# Patient Record
Sex: Female | Born: 1975 | Hispanic: No | Marital: Single | Smoking: Never smoker
Health system: Southern US, Community
[De-identification: ages and names within clinical notes are randomized; demographics above are authoritative.]

## PROBLEM LIST (undated history)

## (undated) DIAGNOSIS — Z9189 Other specified personal risk factors, not elsewhere classified: Secondary | ICD-10-CM

## (undated) DIAGNOSIS — K219 Gastro-esophageal reflux disease without esophagitis: Secondary | ICD-10-CM

## (undated) DIAGNOSIS — I739 Peripheral vascular disease, unspecified: Secondary | ICD-10-CM

## (undated) DIAGNOSIS — G8220 Paraplegia, unspecified: Secondary | ICD-10-CM

## (undated) DIAGNOSIS — F32A Depression, unspecified: Secondary | ICD-10-CM

## (undated) DIAGNOSIS — I2699 Other pulmonary embolism without acute cor pulmonale: Secondary | ICD-10-CM

## (undated) DIAGNOSIS — I959 Hypotension, unspecified: Secondary | ICD-10-CM

## (undated) DIAGNOSIS — G839 Paralytic syndrome, unspecified: Secondary | ICD-10-CM

## (undated) DIAGNOSIS — N319 Neuromuscular dysfunction of bladder, unspecified: Secondary | ICD-10-CM

## (undated) DIAGNOSIS — N390 Urinary tract infection, site not specified: Secondary | ICD-10-CM

## (undated) DIAGNOSIS — F319 Bipolar disorder, unspecified: Secondary | ICD-10-CM

## (undated) DIAGNOSIS — R112 Nausea with vomiting, unspecified: Secondary | ICD-10-CM

## (undated) DIAGNOSIS — F419 Anxiety disorder, unspecified: Secondary | ICD-10-CM

## (undated) DIAGNOSIS — S24109A Unspecified injury at unspecified level of thoracic spinal cord, initial encounter: Secondary | ICD-10-CM

## (undated) DIAGNOSIS — F988 Other specified behavioral and emotional disorders with onset usually occurring in childhood and adolescence: Secondary | ICD-10-CM

## (undated) DIAGNOSIS — Z87442 Personal history of urinary calculi: Secondary | ICD-10-CM

## (undated) DIAGNOSIS — R0600 Dyspnea, unspecified: Secondary | ICD-10-CM

## (undated) DIAGNOSIS — F329 Major depressive disorder, single episode, unspecified: Secondary | ICD-10-CM

## (undated) DIAGNOSIS — Z86718 Personal history of other venous thrombosis and embolism: Secondary | ICD-10-CM

## (undated) DIAGNOSIS — I82409 Acute embolism and thrombosis of unspecified deep veins of unspecified lower extremity: Secondary | ICD-10-CM

## (undated) DIAGNOSIS — Z9889 Other specified postprocedural states: Secondary | ICD-10-CM

## (undated) HISTORY — PX: ABDOMINAL HYSTERECTOMY: SHX81

## (undated) HISTORY — PX: COLOSTOMY: SHX63

## (undated) HISTORY — PX: SUPRAPUBIC CATHETER PLACEMENT: SHX2473

## (undated) HISTORY — DX: Personal history of other venous thrombosis and embolism: Z86.718

## (undated) HISTORY — DX: Major depressive disorder, single episode, unspecified: F32.9

## (undated) HISTORY — DX: Depression, unspecified: F32.A

---

## 1999-11-14 ENCOUNTER — Other Ambulatory Visit: Admission: RE | Admit: 1999-11-14 | Discharge: 1999-11-14 | Payer: Self-pay | Admitting: Obstetrics and Gynecology

## 2000-07-31 ENCOUNTER — Encounter: Payer: Self-pay | Admitting: *Deleted

## 2000-07-31 ENCOUNTER — Encounter: Admission: RE | Admit: 2000-07-31 | Discharge: 2000-07-31 | Payer: Self-pay | Admitting: *Deleted

## 2001-08-07 ENCOUNTER — Other Ambulatory Visit: Admission: RE | Admit: 2001-08-07 | Discharge: 2001-08-07 | Payer: Self-pay | Admitting: Obstetrics and Gynecology

## 2011-03-23 ENCOUNTER — Encounter: Payer: Self-pay | Admitting: Internal Medicine

## 2011-04-24 ENCOUNTER — Ambulatory Visit: Payer: Self-pay | Admitting: Internal Medicine

## 2012-10-22 ENCOUNTER — Other Ambulatory Visit: Payer: Self-pay | Admitting: Obstetrics and Gynecology

## 2012-10-22 DIAGNOSIS — N920 Excessive and frequent menstruation with regular cycle: Secondary | ICD-10-CM

## 2012-11-07 ENCOUNTER — Ambulatory Visit
Admission: RE | Admit: 2012-11-07 | Discharge: 2012-11-07 | Disposition: A | Discharge status: Home / Self Care | Payer: Self-pay | Source: Ambulatory Visit | Attending: Obstetrics and Gynecology | Admitting: Obstetrics and Gynecology

## 2012-11-07 DIAGNOSIS — N920 Excessive and frequent menstruation with regular cycle: Secondary | ICD-10-CM

## 2014-07-06 ENCOUNTER — Inpatient Hospital Stay (HOSPITAL_COMMUNITY): Admission: RE | Admit: 2014-07-06 | Payer: Self-pay | Source: Ambulatory Visit

## 2014-07-13 ENCOUNTER — Ambulatory Visit (HOSPITAL_COMMUNITY): Admission: RE | Admit: 2014-07-13 | Payer: Self-pay | Source: Ambulatory Visit | Admitting: Obstetrics and Gynecology

## 2014-07-13 ENCOUNTER — Encounter (HOSPITAL_COMMUNITY): Admission: RE | Payer: Self-pay | Source: Ambulatory Visit

## 2014-07-13 SURGERY — HYSTERECTOMY, VAGINAL, LAPAROSCOPY-ASSISTED
Anesthesia: Choice

## 2017-03-02 HISTORY — PX: HYSTEROTOMY: SHX1776

## 2017-04-24 ENCOUNTER — Other Ambulatory Visit: Payer: Self-pay | Admitting: Obstetrics and Gynecology

## 2017-04-24 DIAGNOSIS — Z1231 Encounter for screening mammogram for malignant neoplasm of breast: Secondary | ICD-10-CM

## 2017-05-14 ENCOUNTER — Ambulatory Visit: Payer: Self-pay

## 2017-05-15 ENCOUNTER — Ambulatory Visit: Payer: Self-pay

## 2017-06-04 ENCOUNTER — Ambulatory Visit
Admission: RE | Admit: 2017-06-04 | Discharge: 2017-06-04 | Disposition: A | Discharge status: Home / Self Care | Payer: BLUE CROSS/BLUE SHIELD | Source: Ambulatory Visit | Attending: Obstetrics and Gynecology | Admitting: Obstetrics and Gynecology

## 2017-06-04 DIAGNOSIS — Z1231 Encounter for screening mammogram for malignant neoplasm of breast: Secondary | ICD-10-CM

## 2017-09-30 HISTORY — PX: BACK SURGERY: SHX140

## 2017-10-31 HISTORY — PX: LITHOTRIPSY: SUR834

## 2017-11-14 ENCOUNTER — Encounter: Payer: Self-pay | Admitting: Gastroenterology

## 2017-11-21 ENCOUNTER — Encounter: Payer: Self-pay | Admitting: Physical Medicine & Rehabilitation

## 2017-11-26 ENCOUNTER — Ambulatory Visit (INDEPENDENT_AMBULATORY_CARE_PROVIDER_SITE_OTHER): Payer: BLUE CROSS/BLUE SHIELD | Admitting: Gastroenterology

## 2017-11-26 ENCOUNTER — Encounter: Payer: Self-pay | Admitting: Gastroenterology

## 2017-11-26 VITALS — BP 110/62 | HR 102 | Ht 63.0 in | Wt 145.0 lb

## 2017-11-26 DIAGNOSIS — K5900 Constipation, unspecified: Secondary | ICD-10-CM | POA: Diagnosis not present

## 2017-11-26 DIAGNOSIS — R1084 Generalized abdominal pain: Secondary | ICD-10-CM | POA: Diagnosis not present

## 2017-11-26 DIAGNOSIS — K592 Neurogenic bowel, not elsewhere classified: Secondary | ICD-10-CM

## 2017-11-26 DIAGNOSIS — R140 Abdominal distension (gaseous): Secondary | ICD-10-CM | POA: Diagnosis not present

## 2017-11-26 DIAGNOSIS — K219 Gastro-esophageal reflux disease without esophagitis: Secondary | ICD-10-CM | POA: Diagnosis not present

## 2017-11-26 DIAGNOSIS — Z791 Long term (current) use of non-steroidal anti-inflammatories (NSAID): Secondary | ICD-10-CM
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
## 2017-11-26 NOTE — Patient Instructions (Addendum)
Omeprazole 20 mg twice a day or 40 mg in evening with dinner.   Stop Dulcolax tablet.   Start miralax twice a day.   You have been scheduled for a CT scan of the abdomen and pelvis at [facility] ([address]---this is in the same building as Press [facility]).   You are scheduled on 12-05-17 at 3:00 pm. You should arrive 15 minutes prior to your appointment time for registration. Please follow the written instructions below on the day of your exam:  WARNING: IF YOU ARE ALLERGIC TO IODINE/X-RAY DYE, PLEASE NOTIFY RADIOLOGY IMMEDIATELY AT [phone]! YOU WILL BE GIVEN A 13 HOUR PREMEDICATION PREP.  1) Do not eat or drink anything after 11:00 am (4 hours prior to your test) 2) You have been given 2 bottles of oral contrast to drink. The solution may taste better if refrigerated, but do NOT add ice or any other liquid to this solution. Shake well before drinking.    Drink 1 bottle of contrast @ 1:00 pm (2 hours prior to your exam)  Drink 1 bottle of contrast @ 2:00 pm (1 hour prior to your exam)  You may take any medications as prescribed with a small amount of water except for the following: Metformin, Glucophage, Glucovance, Avandamet, Riomet, Fortamet, Actoplus Met, Janumet, Glumetza or Metaglip. The above medications must be held the day of the exam AND 48 hours after the exam.  The purpose of you drinking the oral contrast is to aid in the visualization of your intestinal tract. The contrast solution may cause some diarrhea. Before your exam is started, you will be given a small amount of fluid to drink. Depending on your individual set of symptoms, you may also receive an intravenous injection of x-ray contrast/dye. Plan on being at [facility] for 30 minutes or longer, depending on the type of exam you are having performed.  This test typically takes 30-45 minutes to complete.  If you have any questions regarding your exam or if you need to reschedule, you may  call the CT department at [phone] between the hours of 8:00 am and 5:00 pm, Monday-Friday.  ________________________________________________________________________

## 2017-11-27 DIAGNOSIS — R140 Abdominal distension (gaseous): Secondary | ICD-10-CM | POA: Insufficient documentation

## 2017-11-27 DIAGNOSIS — K5900 Constipation, unspecified: Secondary | ICD-10-CM | POA: Insufficient documentation

## 2017-11-27 DIAGNOSIS — Z791 Long term (current) use of non-steroidal anti-inflammatories (NSAID): Secondary | ICD-10-CM | POA: Insufficient documentation

## 2017-11-27 DIAGNOSIS — K219 Gastro-esophageal reflux disease without esophagitis: Secondary | ICD-10-CM | POA: Insufficient documentation

## 2017-11-27 DIAGNOSIS — R1084 Generalized abdominal pain: Secondary | ICD-10-CM | POA: Insufficient documentation

## 2017-11-29 ENCOUNTER — Encounter

## 2017-12-05 ENCOUNTER — Telehealth: Payer: Self-pay | Admitting: Gastroenterology

## 2017-12-05 ENCOUNTER — Inpatient Hospital Stay: Admission: RE | Admit: 2017-12-05 | Payer: BLUE CROSS/BLUE SHIELD | Source: Ambulatory Visit

## 2017-12-05 ENCOUNTER — Ambulatory Visit (INDEPENDENT_AMBULATORY_CARE_PROVIDER_SITE_OTHER): Payer: BLUE CROSS/BLUE SHIELD

## 2017-12-05 DIAGNOSIS — N83201 Unspecified ovarian cyst, right side: Secondary | ICD-10-CM

## 2017-12-05 DIAGNOSIS — R1084 Generalized abdominal pain: Secondary | ICD-10-CM

## 2017-12-05 DIAGNOSIS — R140 Abdominal distension (gaseous): Secondary | ICD-10-CM

## 2017-12-05 MED ORDER — IOPAMIDOL (ISOVUE-300) INJECTION 61%
100.0000 mL | Freq: Once | INTRAVENOUS | Status: AC | PRN
  Administered 2017-12-05: 100 mL via INTRAVENOUS

## 2017-12-05 NOTE — Telephone Encounter (Signed)
Patient states she was getting nauseated drinking contrast and wanted to know if she needed to drink the whole bottle. She states she called and spoke to someone and they said to just drink as much as possible. She is afraid that if she vomits she will have a hard time making it to the bathroom since she is paraplegic. Patient will call back if she has any problems.

## 2017-12-25 ENCOUNTER — Encounter
Payer: BLUE CROSS/BLUE SHIELD | Attending: Physical Medicine & Rehabilitation | Admitting: Physical Medicine & Rehabilitation

## 2017-12-25 ENCOUNTER — Encounter: Payer: Self-pay | Admitting: Physical Medicine & Rehabilitation

## 2017-12-25 VITALS — BP 120/84 | HR 102

## 2017-12-25 DIAGNOSIS — S22079S Unspecified fracture of T9-T10 vertebra, sequela: Secondary | ICD-10-CM | POA: Diagnosis not present

## 2017-12-25 DIAGNOSIS — K592 Neurogenic bowel, not elsewhere classified: Secondary | ICD-10-CM | POA: Insufficient documentation

## 2017-12-25 DIAGNOSIS — F419 Anxiety disorder, unspecified: Secondary | ICD-10-CM | POA: Diagnosis not present

## 2017-12-25 DIAGNOSIS — F329 Major depressive disorder, single episode, unspecified: Secondary | ICD-10-CM | POA: Diagnosis not present

## 2017-12-25 DIAGNOSIS — G8220 Paraplegia, unspecified: Secondary | ICD-10-CM

## 2017-12-25 DIAGNOSIS — S24103S Unspecified injury at T7-T10 level of thoracic spinal cord, sequela: Secondary | ICD-10-CM | POA: Diagnosis present

## 2017-12-25 DIAGNOSIS — S24103A Unspecified injury at T7-T10 level of thoracic spinal cord, initial encounter: Secondary | ICD-10-CM | POA: Insufficient documentation

## 2017-12-25 DIAGNOSIS — N319 Neuromuscular dysfunction of bladder, unspecified: Secondary | ICD-10-CM | POA: Insufficient documentation

## 2017-12-25 DIAGNOSIS — G8911 Acute pain due to trauma: Secondary | ICD-10-CM

## 2017-12-25 DIAGNOSIS — M545 Low back pain: Secondary | ICD-10-CM | POA: Insufficient documentation
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
## 2017-12-25 NOTE — Patient Instructions (Addendum)
BOWELS:  DOUBLE DOSE YOUR MIRALAX AT NIGHT TRY FLEET ENEMA IN THE MORNING.    BLADDER: Ration fluid intake on days you leave the house.

## 2017-12-26 ENCOUNTER — Other Ambulatory Visit: Payer: Self-pay

## 2017-12-26 ENCOUNTER — Emergency Department (HOSPITAL_BASED_OUTPATIENT_CLINIC_OR_DEPARTMENT_OTHER)
Admission: EM | Admit: 2017-12-26 | Discharge: 2017-12-26 | Disposition: A | Discharge status: Home / Self Care | Payer: BLUE CROSS/BLUE SHIELD | Attending: Emergency Medicine | Admitting: Emergency Medicine

## 2017-12-26 ENCOUNTER — Encounter (HOSPITAL_BASED_OUTPATIENT_CLINIC_OR_DEPARTMENT_OTHER): Payer: Self-pay | Admitting: Emergency Medicine

## 2017-12-26 ENCOUNTER — Emergency Department (HOSPITAL_BASED_OUTPATIENT_CLINIC_OR_DEPARTMENT_OTHER): Payer: BLUE CROSS/BLUE SHIELD

## 2017-12-26 DIAGNOSIS — R1031 Right lower quadrant pain: Secondary | ICD-10-CM

## 2017-12-26 DIAGNOSIS — Z7901 Long term (current) use of anticoagulants: Secondary | ICD-10-CM | POA: Diagnosis not present

## 2017-12-26 DIAGNOSIS — Z79899 Other long term (current) drug therapy: Secondary | ICD-10-CM | POA: Diagnosis not present

## 2017-12-26 DIAGNOSIS — N83201 Unspecified ovarian cyst, right side: Secondary | ICD-10-CM

## 2017-12-26 DIAGNOSIS — R109 Unspecified abdominal pain: Secondary | ICD-10-CM

## 2017-12-26 DIAGNOSIS — N12 Tubulo-interstitial nephritis, not specified as acute or chronic: Secondary | ICD-10-CM

## 2017-12-26 HISTORY — DX: Paraplegia, unspecified: G82.20

## 2017-12-26 LAB — CBC WITH DIFFERENTIAL/PLATELET
BASOS PCT: 1 %
Basophils Absolute: 0 10*3/uL (ref 0.0–0.1)
EOS ABS: 0.4 10*3/uL (ref 0.0–0.7)
EOS PCT: 6 %
HCT: 34.9 % — ABNORMAL LOW (ref 36.0–46.0)
Hemoglobin: 10.9 g/dL — ABNORMAL LOW (ref 12.0–15.0)
LYMPHS ABS: 2.2 10*3/uL (ref 0.7–4.0)
Lymphocytes Relative: 38 %
MCH: 28.2 pg (ref 26.0–34.0)
MCHC: 31.2 g/dL (ref 30.0–36.0)
MCV: 90.4 fL (ref 78.0–100.0)
MONOS PCT: 10 %
Monocytes Absolute: 0.6 10*3/uL (ref 0.1–1.0)
Neutro Abs: 2.6 10*3/uL (ref 1.7–7.7)
Neutrophils Relative %: 45 %
PLATELETS: 246 10*3/uL (ref 150–400)
RBC: 3.86 MIL/uL — ABNORMAL LOW (ref 3.87–5.11)
RDW: 15.2 % (ref 11.5–15.5)
WBC: 5.7 10*3/uL (ref 4.0–10.5)

## 2017-12-26 LAB — COMPREHENSIVE METABOLIC PANEL
ALK PHOS: 112 U/L (ref 38–126)
ALT: 18 U/L (ref 0–44)
AST: 16 U/L (ref 15–41)
Albumin: 3.5 g/dL (ref 3.5–5.0)
Anion gap: 8 (ref 5–15)
BUN: 21 mg/dL — AB (ref 6–20)
CALCIUM: 9 mg/dL (ref 8.9–10.3)
CHLORIDE: 103 mmol/L (ref 98–111)
CO2: 28 mmol/L (ref 22–32)
CREATININE: 0.65 mg/dL (ref 0.44–1.00)
GFR calc Af Amer: >60 mL/min (ref >60)
Glucose, Bld: 115 mg/dL — ABNORMAL HIGH (ref 70–99)
Potassium: 4.1 mmol/L (ref 3.5–5.1)
Sodium: 139 mmol/L (ref 135–145)
Total Bilirubin: 0.4 mg/dL (ref 0.3–1.2)
Total Protein: 6.6 g/dL (ref 6.5–8.1)

## 2017-12-26 LAB — URINALYSIS, ROUTINE W REFLEX MICROSCOPIC
BILIRUBIN URINE: NEGATIVE
Glucose, UA: NEGATIVE mg/dL
KETONES UR: NEGATIVE mg/dL
Nitrite: POSITIVE — AB
PROTEIN: NEGATIVE mg/dL
Specific Gravity, Urine: >1.03 — ABNORMAL HIGH (ref 1.005–1.030)
pH: 6 (ref 5.0–8.0)

## 2017-12-26 LAB — URINALYSIS, MICROSCOPIC (REFLEX)

## 2017-12-26 LAB — I-STAT CG4 LACTIC ACID, ED: LACTIC ACID, VENOUS: 1.45 mmol/L (ref 0.5–1.9)

## 2017-12-26 LAB — I-STAT TROPONIN, ED: Troponin i, poc: 0 ng/mL (ref 0.00–0.08)

## 2017-12-26 MED ORDER — ONDANSETRON 4 MG PO TBDP: 4.0000 mg | ORAL_TABLET | Freq: Three times a day (TID) | ORAL | 0 refills | Status: DC | PRN

## 2017-12-26 MED ORDER — ONDANSETRON HCL 4 MG/2ML IJ SOLN
4.0000 mg | Freq: Once | INTRAMUSCULAR | Status: AC
  Administered 2017-12-26: 4 mg via INTRAVENOUS
  Filled 2017-12-26: qty 2

## 2017-12-26 MED ORDER — KETOROLAC TROMETHAMINE 30 MG/ML IJ SOLN
15.0000 mg | Freq: Once | INTRAMUSCULAR | Status: AC
  Administered 2017-12-26: 15 mg via INTRAVENOUS
  Filled 2017-12-26: qty 1

## 2017-12-26 MED ORDER — SODIUM CHLORIDE 0.9 % IV SOLN
1.0000 g | Freq: Once | INTRAVENOUS | Status: AC
  Administered 2017-12-26: 1 g via INTRAVENOUS
  Filled 2017-12-26: qty 10

## 2017-12-26 MED ORDER — CEPHALEXIN 500 MG PO CAPS: 500.0000 mg | ORAL_CAPSULE | Freq: Four times a day (QID) | ORAL | 0 refills | Status: DC

## 2017-12-26 MED ORDER — FENTANYL CITRATE (PF) 100 MCG/2ML IJ SOLN
50.0000 ug | Freq: Once | INTRAMUSCULAR | Status: AC
  Administered 2017-12-26: 50 ug via INTRAVENOUS
  Filled 2017-12-26: qty 2

## 2017-12-26 MED ORDER — SODIUM CHLORIDE 0.9 % IV BOLUS
1000.0000 mL | Freq: Once | INTRAVENOUS | Status: AC
  Administered 2017-12-26: 1000 mL via INTRAVENOUS

## 2017-12-26 NOTE — ED Triage Notes (Signed)
Pt is a paraplegic, she uses a catheter for urination. C/o R flank pain, cloudy urine and chills x 4 days.

## 2017-12-26 NOTE — Discharge Instructions (Addendum)
Please read and follow all provided instructions.  Your diagnoses today include:  1. Pyelonephritis   2. Cyst of right ovary   3. Right lower quadrant abdominal pain   4. Right flank pain    You have an infection in your urine. We are treating with keflex. You also have a ovarian cyst on your right ovary. Please have a follow up ultrasound in about two months.   Tests performed today include: Urine test - suggests that you have an infection in your bladder Vital signs. See below for your results today.     Home care instructions:  Follow any educational materials contained in this packet.  Follow-up instructions: Please follow-up with your primary care provider in 3 days if symptoms are not resolved for further evaluation of your symptoms.  Return instructions:  Please return to the Emergency Department if you experience worsening symptoms.  Return with fever, worsening pain, persistent vomiting, worsening pain in your back.  Please return if you have any other emergent concerns.  Additional Information:  Your vital signs today were: BP 139/89 (BP Location: Left Arm)    Pulse 90    Temp 97.9 F (36.6 C) (Oral)    Resp 20    Ht 5\' 3"  (1.6 m)    Wt 65.8 kg    SpO2 100%    BMI 25.69 kg/m  If your blood pressure (BP) was elevated above 135/85 this visit, please have this repeated by your doctor within one month. --------------

## 2017-12-26 NOTE — ED Provider Notes (Signed)
[facility] EMERGENCY DEPARTMENT Provider Note   CSN: [id number] Arrival date & time: 12/26/17  1558     History   Chief Complaint Chief Complaint  Patient presents with  . Abdominal Pain    HPI Ashley Gentry is a 42 y.o. female.  Ashley Gentry is a 42 y.o. 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  She also reports associated malodorous urine.  Her spouse is here who takes care of her.  He reports lots of malodorous urine and cloudy colored urine.  Patient has very little sensation below T10.  She reports a dull pain in her right lower quadrant, that was worse last night, but better now.  She is also been having some right flank pain.  She has had kidney stones before.  She is not sure if this is the exact pain as she is recently paraplegic.  She has had a previous abdominal hysterectomy.  No other previous abdominal surgeries.  She has had subjective fevers at home.  No vomiting or diarrhea.  She does straight cath for her neurogenic bladder.  She denies vomiting, diarrhea, vaginal bleeding, syncope, chest pain, coughing, shortness of breath or rashes.  The history is provided by the patient, the spouse and medical records. No language interpreter was used.  Abdominal Pain   Associated symptoms include fever and nausea. Pertinent negatives include diarrhea, vomiting and headaches.    Past Medical History:  Diagnosis Date  . Depression   . Hx of blood clots   . Paraplegia [facility])     Patient Active Problem List   Diagnosis Date Noted  . T10 spinal cord injury (HCC) 12/25/2017  . Paraplegia (HCC) 12/25/2017  . Neurogenic bladder 12/25/2017  . Neurogenic bowel 12/25/2017  . Abdominal distension (gaseous) 11/27/2017  . Generalized abdominal pain 11/27/2017  . Constipation due to neurogenic bowel 11/27/2017  .  Gastroesophageal reflux disease 11/27/2017  . NSAID long-term use 11/27/2017    Past Surgical History:  Procedure Laterality Date  . ABDOMINAL HYSTERECTOMY    . BACK SURGERY  09/2017   Spinal surgery-paralyzed  . HYSTEROTOMY  03/2017  . LITHOTRIPSY Left 10/2017   [facility]     OB History   None      Home Medications    Prior to Admission medications   Medication Sig Start Date End Date Taking? Authorizing Provider  amitriptyline (ELAVIL) 25 MG tablet Take 1 tablet by mouth at bedtime. 10/10/17   [provider]  apixaban (ELIQUIS) 5 MG TABS tablet Take 5 mg by mouth every morning.    [provider]  bisacodyl (DULCOLAX) 5 MG EC tablet Take 1 tablet by mouth daily.    [provider]  Brexpiprazole (REXULTI) 2 MG TABS Take 1 tablet by mouth daily.    [provider]  cephALEXin (KEFLEX) 500 MG capsule Take 1 capsule (500 mg total) by mouth 4 (four) times daily. 12/26/17   Everlene Farrier, PA-C  Enoxaparin Sodium (LOVENOX [place]) Inject 1 application into the skin at bedtime.    [provider]  ERYTHROMYCIN BASE PO Take 1 capsule by mouth QID.    [provider]  flutamide (EULEXIN) 125 MG capsule Take 125 mg by mouth 2 (two) times daily.    [provider]  lamoTRIgine (LAMICTAL) 200 MG tablet Take 200 mg by mouth daily.  [provider]  meloxicam (MOBIC) 7.5 MG tablet Take 7.5 mg by mouth 2 (two) times daily.    [provider]  methocarbamol (ROBAXIN) 500 MG tablet Take 500 mg by mouth 4 (four) times daily.    [provider]  methylphenidate 54 MG PO CR tablet Take 54 mg by mouth daily.    [provider]  mirabegron ER (MYRBETRIQ) 50 MG TB24 tablet Take 50 mg by mouth daily.    [provider]  Naloxegol Oxalate (MOVANTIK PO) Take 1 tablet by mouth daily.    [provider]  omeprazole (PRILOSEC OTC) 20 MG tablet Take 20 mg by mouth 2 (two) times daily.    [provider]  ondansetron (ZOFRAN ODT) 4 MG disintegrating tablet Take 1 tablet (4 mg total) by mouth every 8 (eight) hours as needed for nausea or vomiting. 12/26/17   Everlene Farrier, PA-C  oxyCODONE-acetaminophen (PERCOCET/ROXICET) 5-325 MG tablet Take 2 tablets by mouth every 4 (four) hours as needed for severe pain.    [provider]  Pregabalin (LYRICA PO) Take 1 capsule by mouth 3 (three) times daily.    [provider]  QUEtiapine (SEROQUEL) 400 MG tablet Take 400 mg by mouth at bedtime.    [provider]  TRAMADOL HCL PO Take 1 tablet by mouth every 4 (four) hours as needed.    [provider]    Family History Family History  Problem Relation Age of Onset  . Colon cancer Maternal Grandfather   . Esophageal cancer Neg Hx   . Rectal cancer Neg Hx     Social History Social History   Tobacco Use  . Smoking status: Never Smoker  . Smokeless tobacco: Never Used  Substance Use Topics  . Alcohol use: Not Currently  . Drug use: Never     Allergies   Patient has no known allergies.   Review of Systems Review of Systems  Constitutional: Positive for chills and fever.  HENT: Negative for congestion and sore throat.   Eyes: Negative for visual disturbance.  Respiratory: Negative for cough, shortness of breath and wheezing.   Cardiovascular: Negative for chest pain and palpitations.  Gastrointestinal: Positive for abdominal pain and nausea. Negative for diarrhea and vomiting.  Genitourinary: Positive for flank pain.  Musculoskeletal: Negative for back pain and neck pain.  Skin: Negative for rash.  Neurological: Negative for syncope and headaches.     Physical Exam Updated Vital Signs BP (!) 150/98 (BP Location: Right Arm)   Pulse 86   Temp 97.9 F (36.6 C) (Oral)   Resp 12   Ht 5\' 3"  (1.6 m)   Wt 65.8 kg   SpO2 100%   BMI 25.69 kg/m   Physical Exam  Constitutional: She appears well-developed and well-nourished.  Non-toxic  appearance. She does not appear ill. No distress.  Nontoxic appearing.  HENT:  Head: Normocephalic and atraumatic.  Mouth/Throat: Oropharynx is clear and moist.  Eyes: Pupils are equal, round, and reactive to light. Conjunctivae are normal. Right eye exhibits no discharge. Left eye exhibits no discharge.  Neck: Neck supple.  Cardiovascular: Regular rhythm, normal heart sounds and intact distal pulses. Exam reveals no gallop and no friction rub.  No murmur heard. HR 100.   Pulmonary/Chest: Effort normal and breath sounds normal. No respiratory distress. She has no wheezes. She has no rales.  Abdominal: Soft. Bowel sounds are normal. There is tenderness in the right lower quadrant.  Abdomen is soft.  Bowel sounds  are present.  She has mild generalized right lower quadrant tenderness to palpation.  She has right flank tenderness to palpation as well.  Musculoskeletal: She exhibits no edema.  Lymphadenopathy:    She has no cervical adenopathy.  Neurological: She is alert. Coordination normal.  Skin: Skin is warm and dry. Capillary refill takes less than 2 seconds. No rash noted. She is not diaphoretic. No erythema. No pallor.  Psychiatric: She has a normal mood and affect. Her behavior is normal.  Nursing note and vitals reviewed.    ED Treatments / Results  Labs (all labs ordered are listed, but only abnormal results are displayed) Labs Reviewed  COMPREHENSIVE METABOLIC PANEL - Abnormal; Notable for the following components:      Result Value   Glucose, Bld 115 (*)    BUN 21 (*)    All other components within normal limits  CBC WITH DIFFERENTIAL/PLATELET - Abnormal; Notable for the following components:   RBC 3.86 (*)    Hemoglobin 10.9 (*)    HCT 34.9 (*)    All other components within normal limits  URINALYSIS, ROUTINE W REFLEX MICROSCOPIC - Abnormal; Notable for the following components:   APPearance CLOUDY (*)    Specific Gravity, Urine >1.030 (*)    Hgb urine dipstick LARGE  (*)    Nitrite POSITIVE (*)    Leukocytes, UA SMALL (*)    All other components within normal limits  URINALYSIS, MICROSCOPIC (REFLEX) - Abnormal; Notable for the following components:   Bacteria, UA MANY (*)    All other components within normal limits  URINE CULTURE  I-STAT CG4 LACTIC ACID, ED  I-STAT TROPONIN, ED  I-STAT CG4 LACTIC ACID, ED    EKG None  Radiology Ct Renal Stone Study  Result Date: 12/26/2017 CLINICAL DATA:  Right flank pain, cloudy urine and chills for the past 4 days. Paraplegic. The patient uses a catheter urination. EXAM: CT ABDOMEN AND PELVIS WITHOUT CONTRAST TECHNIQUE: Multidetector CT imaging of the abdomen and pelvis was performed following the standard protocol without IV contrast. COMPARISON:  12/05/2017. FINDINGS: Lower chest: Stable minimal left basilar atelectasis or scarring. Hepatobiliary: No focal liver abnormality is seen. No gallstones, gallbladder wall thickening, or biliary dilatation. Pancreas: Unremarkable. No pancreatic ductal dilatation or surrounding inflammatory changes. Spleen: Normal in size without focal abnormality. Adrenals/Urinary Tract: Adrenal glands are unremarkable. Kidneys are normal, without renal calculi, focal lesion, or hydronephrosis. Bladder is unremarkable. Stomach/Bowel: Prominent stool throughout the colon. Unremarkable stomach and small bowel. No evidence of appendicitis. Vascular/Lymphatic: No significant vascular findings are present. No enlarged abdominal or pelvic lymph nodes. Reproductive: Status post hysterectomy. Interval decrease in size of the complex right adnexal cystic lesion with no overall increase in density. This is mildly heterogeneous and currently measures 4.6 x 2.9 cm, previously 4.8 x 4.5 cm. Normal appearing left ovary. Other: No abdominal wall hernia or abnormality. No abdominopelvic ascites. Musculoskeletal: Stable pedicle screw and rod fixation of the thoracolumbar spine. The remainder the bones are  unremarkable. IMPRESSION: 1. No evidence of obstructive uropathy. 2. Interval decrease in size of the complex right adnexal cystic lesion, currently measuring 4.6 x 2.9 cm, previously 4.8 x 4.5 cm. This most likely represents a resolving hemorrhagic cyst. A follow-up transabdominal and transvaginal pelvic ultrasound is recommended in 2 months. 3. Prominent stool throughout the colon. Electronically Signed   By: Beckie Salts M.D.   On: 12/26/2017 18:41    Procedures Procedures (including critical care time)  Medications Ordered in ED Medications  sodium chloride 0.9 % bolus 1,000 mL (0 mLs Intravenous Stopped 12/26/17 1850)  ondansetron (ZOFRAN) injection 4 mg (4 mg Intravenous Given 12/26/17 1735)  fentaNYL (SUBLIMAZE) injection 50 mcg (50 mcg Intravenous Given 12/26/17 1735)  cefTRIAXone (ROCEPHIN) 1 g in sodium chloride 0.9 % 100 mL IVPB (0 g Intravenous Stopped 12/26/17 1924)  ketorolac (TORADOL) 30 MG/ML injection 15 mg (15 mg Intravenous Given 12/26/17 2003)     Initial Impression / Assessment and Plan / ED Course  I have reviewed the triage vital signs and the nursing notes.  Pertinent labs & imaging results that were available during my care of the patient were reviewed by me and considered in my medical decision making (see chart for details).    This is a 42 y.o. 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  She also reports associated malodorous urine.  Her spouse is here who takes care of her.  He reports lots of malodorous urine and cloudy colored urine.  Patient has very little sensation below T10.  She reports a dull pain in her right lower quadrant, that was worse last night, but better now.  She is also been having some right flank pain.  She has had kidney stones before.  She is not sure if this is the exact pain as she is recently  paraplegic.  She has had a previous abdominal hysterectomy.  No other previous abdominal surgeries.  She has had subjective fevers at home.  No vomiting or diarrhea.  She does straight cath for her neurogenic bladder.  On exam the patient is afebrile and nontoxic appearing.  She is mildly tachycardic on initial evaluation.  Her abdomen is soft and she has mild right lower quadrant tenderness as well as some right flank tenderness to palpation.  Patient reports that she is been having some muscle pain around her right flank and she is not sure if this is new pain or not. Urinalysis shows infection.  Nitrite positive urine.  Many bacteria.  Urine sent for culture.  Patient started on Rocephin.  On extensive chart review I was not able to find any recent urine cultures. CBC shows no leukocytosis.  CMP shows normal liver enzymes and preserved kidney function. CT renal stone study shows no obstructive uropathy.  It does show a complex right adnexal cystic lesion that is shrinking in size from her last scan.  There is also prominent stool burden.  At reevaluation patient is feeling much better.  She is no longer having any abdominal pain.  As she is a paraplegic and its unclear if she is having new flank pain we will treat for pyelonephritis with Keflex 4 times daily for 10 days.  Urine has been sent for culture.  She has no obstructive uropathy on CT scan.  I encouraged her to have a repeat ultrasound in 2 months for her right ovarian cyst.  Patient agrees with plan.  I discussed strict and specific return precautions. I advised the patient to follow-up with their primary care provider this week. I advised the patient to return to the emergency department with new or worsening symptoms or new concerns. The patient verbalized understanding and agreement with plan.    This patient was discussed with Dr. Adela Lank who agrees with assessment and plan.    Final Clinical Impressions(s) / ED Diagnoses   Final  diagnoses:  Pyelonephritis  Cyst of right ovary  Right lower quadrant abdominal pain  Right flank pain    ED Discharge Orders         Ordered    cephALEXin (KEFLEX) 500 MG capsule  4 times daily,   Status:  Discontinued     12/26/17 1939    ondansetron (ZOFRAN ODT) 4 MG disintegrating tablet  Every 8 hours PRN,   Status:  Discontinued     12/26/17 1939    cephALEXin (KEFLEX) 500 MG capsule  4 times daily     12/26/17 1944    ondansetron (ZOFRAN ODT) 4 MG disintegrating tablet  Every 8 hours PRN     12/26/17 1944           Everlene Farrier, PA-C 12/26/17 2015    Melene Plan, DO 12/26/17 2309

## 2017-12-29 LAB — URINE CULTURE: Culture: >=100000 — AB

## 2017-12-30 ENCOUNTER — Telehealth: Payer: Self-pay | Admitting: Emergency Medicine

## 2017-12-30 ENCOUNTER — Telehealth: Payer: Self-pay | Admitting: *Deleted

## 2017-12-30 NOTE — Telephone Encounter (Signed)
Patient left a message inquiring about her referral to a neurosurgeon.  I contacted patient and left a voicemail  Informing that referral was submitted to [facility] ( Dr. Conchita Paris) on September 25th.  I informed that referrals can take a couple of weeks to process. They will contact once referral has been approved.

## 2017-12-30 NOTE — Telephone Encounter (Signed)
Post ED Visit - Positive Culture Follow-up  Culture report reviewed by antimicrobial stewardship pharmacist:  []  Enzo Bi, Pharm.D. []  Celedonio Miyamoto, Pharm.D., BCPS AQ-ID []  Garvin Fila, Pharm.D., BCPS []  Georgina Pillion, Pharm.D., BCPS []  [place], [address].D., BCPS, AAHIVP []  Estella Husk, Pharm.D., BCPS, AAHIVP []  Lysle Pearl, PharmD, BCPS []  Phillips Climes, PharmD, BCPS [x]  Agapito Games, PharmD, BCPS []  Verlan Friends, PharmD  Positive Urine culture Treated with Cephalexin, organism sensitive to the same and no further patient follow-up is required at this time.  Ashley Gentry 12/30/2017, 11:00 AM

## 2018-01-15 ENCOUNTER — Ambulatory Visit: Payer: BLUE CROSS/BLUE SHIELD | Admitting: Internal Medicine

## 2018-01-15 ENCOUNTER — Telehealth: Payer: Self-pay

## 2018-01-15 NOTE — Telephone Encounter (Signed)
No show letter mailed to patient. 

## 2018-01-17 ENCOUNTER — Ambulatory Visit: Payer: Self-pay | Admitting: Psychiatry

## 2018-01-20 ENCOUNTER — Inpatient Hospital Stay: Admission: RE | Admit: 2018-01-20 | Payer: BLUE CROSS/BLUE SHIELD | Source: Ambulatory Visit

## 2018-01-20 ENCOUNTER — Telehealth: Payer: Self-pay | Admitting: Physical Medicine & Rehabilitation

## 2018-01-20 NOTE — Telephone Encounter (Signed)
Ashley Gentry with [facility] called to let us know that this patient has been declined to be seen by their office.  She was a previously referred from Dr. Nedra Hai and a self referral by the patient.  They both were declined.  Do you have any other suggestions where you may want to send this patient?  Thanks.

## 2018-01-21 NOTE — Telephone Encounter (Signed)
I called their office and typically they don't give a reason, but Roselyn Meier with their office said that if they just had surgery they normally don't see them.  They recommend that patient  follow up with who was treating them.  She also said that they wouldn't see anyone until at least a year after surgery.

## 2018-01-21 NOTE — Telephone Encounter (Signed)
Why was she declined?

## 2018-01-21 NOTE — Telephone Encounter (Signed)
I talked with Dr. Patric Dykes about her case and explained the scenario that she wanted to change her care to local her in [place]. There didn't appear to be an issue.

## 2018-01-22 ENCOUNTER — Encounter: Payer: BLUE CROSS/BLUE SHIELD | Admitting: Physical Medicine & Rehabilitation

## 2018-01-22 DIAGNOSIS — F900 Attention-deficit hyperactivity disorder, predominantly inattentive type: Secondary | ICD-10-CM

## 2018-01-22 DIAGNOSIS — G4700 Insomnia, unspecified: Secondary | ICD-10-CM

## 2018-01-22 DIAGNOSIS — F458 Other somatoform disorders: Principal | ICD-10-CM

## 2018-01-22 DIAGNOSIS — F3181 Bipolar II disorder: Secondary | ICD-10-CM

## 2018-01-22 DIAGNOSIS — F419 Anxiety disorder, unspecified: Secondary | ICD-10-CM | POA: Insufficient documentation

## 2018-01-24 ENCOUNTER — Ambulatory Visit
Admission: RE | Admit: 2018-01-24 | Discharge: 2018-01-24 | Disposition: A | Discharge status: Home / Self Care | Payer: BLUE CROSS/BLUE SHIELD | Source: Ambulatory Visit | Attending: Physical Medicine & Rehabilitation | Admitting: Physical Medicine & Rehabilitation

## 2018-01-24 DIAGNOSIS — G8911 Acute pain due to trauma: Secondary | ICD-10-CM

## 2018-01-27 ENCOUNTER — Encounter
Payer: BLUE CROSS/BLUE SHIELD | Attending: Physical Medicine & Rehabilitation | Admitting: Physical Medicine & Rehabilitation

## 2018-01-27 ENCOUNTER — Encounter: Payer: Self-pay | Admitting: Physical Medicine & Rehabilitation

## 2018-01-27 VITALS — BP 130/98 | HR 88 | Resp 16

## 2018-01-27 DIAGNOSIS — K592 Neurogenic bowel, not elsewhere classified: Secondary | ICD-10-CM

## 2018-01-27 DIAGNOSIS — G8220 Paraplegia, unspecified: Secondary | ICD-10-CM

## 2018-01-27 DIAGNOSIS — F329 Major depressive disorder, single episode, unspecified: Secondary | ICD-10-CM | POA: Diagnosis not present

## 2018-01-27 DIAGNOSIS — S24103S Unspecified injury at T7-T10 level of thoracic spinal cord, sequela: Secondary | ICD-10-CM | POA: Diagnosis present

## 2018-01-27 DIAGNOSIS — F419 Anxiety disorder, unspecified: Secondary | ICD-10-CM | POA: Diagnosis not present

## 2018-01-27 DIAGNOSIS — M545 Low back pain: Secondary | ICD-10-CM | POA: Insufficient documentation

## 2018-01-27 DIAGNOSIS — N319 Neuromuscular dysfunction of bladder, unspecified: Secondary | ICD-10-CM

## 2018-01-27 NOTE — Progress Notes (Signed)
Subjective:    Patient ID: Ashley Gentry, female    DOB: 1975-10-29, 42 y.o.   MRN: [id number]  HPI   [name] is here in follow-up of her thoracic spinal cord injury.  I last saw her a month ago.  I reviewed the patient's imaging in the office with her and family today.  Thoracic imaging shows stable thoracic spine with ongoing anterolisthesis of T10 on T11 with mild deformity still noted at the anterior portion of T11.  The lumbar spine appears intact and there is no sign of obvious fracture or loss of vertebral body height.  All disc spaces look within normal limits.  She is continued to have pain radiating from the back into the flanks.  However, two and a half weeks ago, she developed urinary symptoms, ?UTI. Was seen in the ED. She also has had right lower quadrant pain as well over that time.  She apparently was given antibiotics but is still having some symptoms.  The pain in her right flank seems different than the nerve pain she is experienced in the past.  We discussed her bowel program at length at last visit visit but it appears to be an ongoing problem.  She goes from days of no bowel movements to large liquid blowouts.  Fluid intake is consistent as well as p.o. intake.  Her bowel regimen has been all over the place.  She still remains very limited by her brace and cannot perform bowel program or her bladder program.  Significant other helps with a substantially.   Pain Inventory Average Pain 7 Pain Right Now 7 My pain is stabbing and throbbing  In the last 24 hours, has pain interfered with the following? General activity na Relation with others na Enjoyment of life na What TIME of day is your pain at its worst? evening Sleep (in general) Good  Pain is worse with: unsure Pain improves with: ? Relief from Meds: varies  Mobility use a wheelchair  Function disabled: date disabled .  Neuro/Psych bladder control problems trouble walking  Prior Studies Any  changes since last visit?  no  Physicians involved in your care Any changes since last visit?  no   Family History  Problem Relation Age of Onset  . Colon cancer Maternal Grandfather   . Esophageal cancer Neg Hx   . Rectal cancer Neg Hx    Social History   Socioeconomic History  . Marital status: Single    Spouse name: Not on file  . Number of children: 0  . Years of education: Not on file  . Highest education level: Not on file  Occupational History  . Not on file  Social Needs  . Financial resource strain: Not on file  . Food insecurity:    Worry: Not on file    Inability: Not on file  . Transportation needs:    Medical: Not on file    Non-medical: Not on file  Tobacco Use  . Smoking status: Never Smoker  . Smokeless tobacco: Never Used  Substance and Sexual Activity  . Alcohol use: Not Currently  . Drug use: Never  . Sexual activity: Not on file  Lifestyle  . Physical activity:    Days per week: Not on file    Minutes per session: Not on file  . Stress: Not on file  Relationships  . Social connections:    Talks on phone: Not on file    Gets together: Not on file  Attends religious service: Not on file    Active member of club or organization: Not on file    Attends meetings of clubs or organizations: Not on file    Relationship status: Not on file  Other Topics Concern  . Not on file  Social History Narrative  . Not on file   Past Surgical History:  Procedure Laterality Date  . ABDOMINAL HYSTERECTOMY    . BACK SURGERY  09/2017   Spinal surgery-paralyzed  . HYSTEROTOMY  03/2017  . LITHOTRIPSY Left 10/2017   [facility]   Past Medical History:  Diagnosis Date  . Depression   . Hx of blood clots   . Paraplegia (HCC)    BP (!) 130/98   Pulse 88   Resp 16   SpO2 97%   Opioid Risk Score:   Fall Risk Score:  `1  Depression screen PHQ 2/9  No flowsheet data found.  Review of Systems  Constitutional: Negative.   HENT: Negative.   Eyes:  Negative.   Respiratory: Negative.   Gastrointestinal: Positive for abdominal pain.  Endocrine: Negative.   Genitourinary: Positive for difficulty urinating.  Musculoskeletal: Positive for gait problem.  Skin: Negative.   Allergic/Immunologic: Negative.   Hematological: Negative.   Psychiatric/Behavioral: Negative.   All other systems reviewed and are negative.      Objective:   Physical Exam  General: No acute distress HEENT: EOMI, oral membranes moist Cards: reg rate  Chest: normal effort Abdomen: Soft, no palpable masses. TTP RLQ Skin: dry, intact Extremities: no edema  Skin: Clean and intact without signs of breakdown Neuro: Pt is cognitively appropriate with normal insight, memory, and awareness. Cranial nerves 2-12 are intact.  . Reflexes are 2+ in all 4's. Fine motor coordination is intact. No tremors. A few beats of clonus in each leg. No resting tone. Motor remains  0/5 bilateral lower ext. Does sense gross touch below level of injury but inconsistent. Sensory level remains around T9-10.  Musculoskeletal: wearing a TLSO which has been cut down to essentially an LSO brace which appears to be fitting appropriately Psych: anxious but pleasant         Assessment & Plan:  1. T10 SCI motor complete 2. Hx of T10 -11 fx and discloaction 3. L1 pedicle/ compression fracture which occurred post-operatively 4. History of depression and claustrophobia, ?anxiety disorder   Plan: 1.  Spent amounts of time today discussing anatomy and pathophysiology behind her pain and injury. 2. CT's reviewed at length.  She has the anterolisthesis at T10 on T11 with a anterior fragment noted on T11.  Hardware appears stable.  L1 fracture is nonexistent.  Would still like her to see neurosurgery but given the chronicity of her back injury I do not see any specific need for the brace at this point.  Its only hindering her self-care 3.  Given her right lower quadrant pain, I ordered a  urinalysis and urine culture today.  May be related to her bowels.  Consider abdominal ultrasound or CT. 4. Continue pain mgt as she's doing 5.  Intermittent catheterizations as needed. 6.  Reviewed bowel program at length again today.  Asked him to start with senna S1-2 tablets at bedtime with Dulcolax suppository in the morning.  We can build from there.  Discussed the importance of regular fluid and solid intake. 7.  Discussed her tachycardia which may be an autonomic response to her bowels being backed up as well as a UTI. 8. Consider inpatient rehab stay now  that she is out of the brace.  We will discuss with her admission team.  There are certainly things to work on such as her bowel program intermittent catheterizations, pain management, improved functional mobility, as well as further education.  Follow up in about a month. 30 min  was spent in direct consultation, review, examination, etc. I will see her back in about a month's time.

## 2018-01-27 NOTE — Patient Instructions (Addendum)
PLEASE FEEL FREE TO CALL OUR OFFICE WITH ANY PROBLEMS OR QUESTIONS [phone])    BOWEL PROGRAM:  SENNA-S 1-2 TABS AT BED TIME  DULCOLAX SUPPOSITORY IN THE AM.

## 2018-01-30 ENCOUNTER — Telehealth: Payer: Self-pay | Admitting: Physical Medicine & Rehabilitation

## 2018-01-30 NOTE — Telephone Encounter (Signed)
Recd call from [employer] and [facility] - need last med recs sent to [facility]  - recd fax confirmation from both before 9 am today -LVM  For [place] and spoke with Clayburn Pert

## 2018-01-31 ENCOUNTER — Ambulatory Visit: Payer: Self-pay | Admitting: Psychiatry

## 2018-02-05 ENCOUNTER — Other Ambulatory Visit: Payer: Self-pay | Admitting: Urology

## 2018-02-06 ENCOUNTER — Other Ambulatory Visit: Payer: Self-pay

## 2018-02-06 ENCOUNTER — Encounter (HOSPITAL_COMMUNITY): Payer: Self-pay | Admitting: *Deleted

## 2018-02-07 ENCOUNTER — Ambulatory Visit: Payer: Self-pay | Admitting: Psychiatry

## 2018-02-14 ENCOUNTER — Ambulatory Visit: Payer: Self-pay | Admitting: Psychiatry

## 2018-02-14 ENCOUNTER — Telehealth: Payer: Self-pay | Admitting: Internal Medicine

## 2018-02-14 NOTE — Telephone Encounter (Signed)
Error

## 2018-02-17 ENCOUNTER — Ambulatory Visit: Payer: Self-pay | Admitting: Psychiatry

## 2018-02-17 ENCOUNTER — Other Ambulatory Visit: Payer: Self-pay | Admitting: Gastroenterology

## 2018-02-17 DIAGNOSIS — K5900 Constipation, unspecified: Secondary | ICD-10-CM

## 2018-02-18 ENCOUNTER — Encounter
Payer: BLUE CROSS/BLUE SHIELD | Attending: Physical Medicine & Rehabilitation | Admitting: Physical Medicine & Rehabilitation

## 2018-02-18 DIAGNOSIS — S24103S Unspecified injury at T7-T10 level of thoracic spinal cord, sequela: Secondary | ICD-10-CM | POA: Insufficient documentation

## 2018-02-18 DIAGNOSIS — F329 Major depressive disorder, single episode, unspecified: Secondary | ICD-10-CM | POA: Insufficient documentation

## 2018-02-18 DIAGNOSIS — M545 Low back pain: Secondary | ICD-10-CM | POA: Insufficient documentation

## 2018-02-18 DIAGNOSIS — F419 Anxiety disorder, unspecified: Secondary | ICD-10-CM | POA: Insufficient documentation

## 2018-02-19 ENCOUNTER — Telehealth: Payer: Self-pay | Admitting: Physical Medicine & Rehabilitation

## 2018-02-19 NOTE — Telephone Encounter (Signed)
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  Advised she missed appointment yesterday and it would be after first of year before next opening.  Spoke to Dr. Riley KillSwartz - he advised since condition has changed she would need physicians in hospital would need to do referral for her as they know present state.  Called Ashley Gentry back and advised of such.Marland Kitchen..Marland Kitchen

## 2018-02-20 NOTE — Progress Notes (Signed)
Spoke with patient and she is currently [facility].  Patient has no idea when will be discharged.  Patient stated she had left a message with [facility] regarding hospitalization.  [facility].

## 2018-02-24 ENCOUNTER — Ambulatory Visit: Payer: Self-pay | Admitting: Psychiatry

## 2018-02-25 ENCOUNTER — Ambulatory Visit (HOSPITAL_COMMUNITY): Admission: RE | Admit: 2018-02-25 | Payer: BLUE CROSS/BLUE SHIELD | Source: Ambulatory Visit | Admitting: Urology

## 2018-02-25 HISTORY — DX: Dyspnea, unspecified: R06.00

## 2018-02-25 HISTORY — DX: Nausea with vomiting, unspecified: R11.2

## 2018-02-25 HISTORY — DX: Anxiety disorder, unspecified: F41.9

## 2018-02-25 HISTORY — DX: Neuromuscular dysfunction of bladder, unspecified: N31.9

## 2018-02-25 HISTORY — DX: Acute embolism and thrombosis of unspecified deep veins of unspecified lower extremity: I82.409

## 2018-02-25 HISTORY — DX: Urinary tract infection, site not specified: N39.0

## 2018-02-25 HISTORY — DX: Bipolar disorder, unspecified: F31.9

## 2018-02-25 HISTORY — DX: Other specified behavioral and emotional disorders with onset usually occurring in childhood and adolescence: F98.8

## 2018-02-25 HISTORY — DX: Unspecified injury at unspecified level of thoracic spinal cord, initial encounter: S24.109A

## 2018-02-25 HISTORY — DX: Gastro-esophageal reflux disease without esophagitis: K21.9

## 2018-02-25 HISTORY — DX: Personal history of urinary calculi: Z87.442

## 2018-02-25 HISTORY — DX: Other specified postprocedural states: Z98.890

## 2018-02-25 SURGERY — BOTOX INJECTION
Anesthesia: Choice

## 2018-03-07 ENCOUNTER — Ambulatory Visit: Payer: Self-pay | Admitting: Psychiatry

## 2018-03-14 ENCOUNTER — Ambulatory Visit: Payer: Self-pay | Admitting: Psychiatry

## 2018-03-21 ENCOUNTER — Ambulatory Visit: Payer: Self-pay | Admitting: Psychiatry

## 2018-04-02 HISTORY — PX: COLONOSCOPY: SHX174

## 2018-04-04 ENCOUNTER — Ambulatory Visit: Payer: Self-pay | Admitting: Psychiatry

## 2018-05-19 ENCOUNTER — Ambulatory Visit: Payer: BLUE CROSS/BLUE SHIELD | Attending: Physical Medicine and Rehabilitation | Admitting: Occupational Therapy

## 2018-05-19 ENCOUNTER — Other Ambulatory Visit: Payer: Self-pay

## 2018-05-19 ENCOUNTER — Encounter: Payer: Self-pay | Admitting: Occupational Therapy

## 2018-05-19 ENCOUNTER — Ambulatory Visit: Payer: BLUE CROSS/BLUE SHIELD | Admitting: Physical Therapy

## 2018-05-19 DIAGNOSIS — M545 Low back pain: Secondary | ICD-10-CM | POA: Diagnosis present

## 2018-05-19 DIAGNOSIS — M6281 Muscle weakness (generalized): Secondary | ICD-10-CM | POA: Diagnosis present

## 2018-05-19 DIAGNOSIS — G8929 Other chronic pain: Secondary | ICD-10-CM | POA: Insufficient documentation

## 2018-05-19 DIAGNOSIS — G8221 Paraplegia, complete: Secondary | ICD-10-CM
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
## 2018-05-21 ENCOUNTER — Telehealth: Payer: Self-pay
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
## 2018-05-23 ENCOUNTER — Telehealth: Payer: Self-pay | Admitting: Psychiatry

## 2018-05-23 DIAGNOSIS — F900 Attention-deficit hyperactivity disorder, predominantly inattentive type: Secondary | ICD-10-CM

## 2018-05-23 MED ORDER — CONCERTA 54 MG PO TBCR: 54.0000 mg | EXTENDED_RELEASE_TABLET | ORAL | 0 refills | Status: DC

## 2018-05-23 NOTE — Telephone Encounter (Signed)
Left voicemail with information and to call back for an appointment or concerns.

## 2018-05-23 NOTE — Telephone Encounter (Signed)
-----   Message from Lambert Keto, LPN sent at 06/13/9700  4:32 PM EST ----- Pt asking if you can fill her Concerta ER 54 mg 1 daily? Just got out of rehab for her spinal injury and asking if you will prescribe till she can get an appt with you.  Last OV 11/25/2017 Last fill 03/16/2018 #30 by Dr Braulio Bosch, looks like different providers have been filling. Maybe while in rehab?   [facility].   Thanks  [employer]

## 2018-06-13 ENCOUNTER — Ambulatory Visit: Payer: Self-pay | Admitting: Physical Medicine & Rehabilitation

## 2018-06-13 ENCOUNTER — Encounter: Payer: BLUE CROSS/BLUE SHIELD | Attending: Physical Medicine & Rehabilitation

## 2018-06-16 ENCOUNTER — Ambulatory Visit: Payer: BLUE CROSS/BLUE SHIELD | Admitting: Occupational Therapy

## 2018-06-16 ENCOUNTER — Ambulatory Visit: Payer: BLUE CROSS/BLUE SHIELD | Admitting: Physical Therapy

## 2018-06-19 ENCOUNTER — Ambulatory Visit: Payer: BLUE CROSS/BLUE SHIELD | Admitting: Occupational Therapy

## 2018-06-19 ENCOUNTER — Ambulatory Visit: Payer: BLUE CROSS/BLUE SHIELD | Admitting: Physical Therapy

## 2018-06-23 ENCOUNTER — Ambulatory Visit: Payer: BLUE CROSS/BLUE SHIELD | Admitting: Physical Therapy

## 2018-06-23 ENCOUNTER — Encounter: Payer: Self-pay | Admitting: Occupational Therapy

## 2018-06-23 ENCOUNTER — Ambulatory Visit: Payer: BLUE CROSS/BLUE SHIELD | Admitting: Occupational Therapy

## 2018-06-23 NOTE — Therapy (Signed)
[facility] [address] [place], [place], [address] Phone: [phone]   Fax:  [phone]  Patient Details  Name: Ranezmae Cramblit MRN: [id number] Date of Birth: 1975-06-15 Referring Provider:  No ref. provider found  Encounter Date: 06/23/2018  Patient was called to notify that center was closed for the next two weeks due to COVID19. Patient had missed previously scheduled appointments due to infection. Patient indicated that she was working to get therapy services closer to her home,a nd has opted to restart services in [employer]. Will discharge patient from further OT at this time.   Explained to patient that she was always welcomed back here with new MD order.  Patient expressed understanding and appreciation.  Collier Salina 06/23/2018, 2:49 PM  [facility] [address] [place], [place], [address] Phone: [phone]   Fax:  [phone]

## 2018-06-25 ENCOUNTER — Ambulatory Visit: Payer: BLUE CROSS/BLUE SHIELD | Admitting: Physical Therapy

## 2018-06-25 ENCOUNTER — Ambulatory Visit: Payer: BLUE CROSS/BLUE SHIELD | Admitting: Occupational Therapy

## 2018-07-01 ENCOUNTER — Encounter: Payer: BLUE CROSS/BLUE SHIELD | Admitting: Occupational Therapy

## 2018-07-01 ENCOUNTER — Ambulatory Visit: Payer: BLUE CROSS/BLUE SHIELD | Admitting: Physical Therapy

## 2018-07-03 ENCOUNTER — Ambulatory Visit (INDEPENDENT_AMBULATORY_CARE_PROVIDER_SITE_OTHER): Payer: BLUE CROSS/BLUE SHIELD | Admitting: Internal Medicine

## 2018-07-03 ENCOUNTER — Ambulatory Visit: Payer: BLUE CROSS/BLUE SHIELD | Admitting: Internal Medicine

## 2018-07-03 ENCOUNTER — Encounter: Payer: Self-pay | Admitting: Internal Medicine

## 2018-07-03 ENCOUNTER — Other Ambulatory Visit: Payer: Self-pay

## 2018-07-03 DIAGNOSIS — K5900 Constipation, unspecified: Secondary | ICD-10-CM | POA: Diagnosis not present

## 2018-07-03 DIAGNOSIS — K592 Neurogenic bowel, not elsewhere classified: Secondary | ICD-10-CM

## 2018-07-03 NOTE — Assessment & Plan Note (Addendum)
I doubt the oral medications she is taking are going to work. Think she will need some sort of enema regimen or purge perhaps.   Will call next week.  Updated plan  DC erythromycin, Trulance  Do following   senokot-s 2 tabs at HS with 10mg  dulcolax suppository in AM.  If this isn't effective, try miralax at night  If necessary add a fleet enema in the AM.   If this fails consider seeing a surgeon about a colostomy No need for a CT scan
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
## 2018-07-04 ENCOUNTER — Ambulatory Visit: Payer: BLUE CROSS/BLUE SHIELD | Admitting: Physical Therapy

## 2018-07-04 ENCOUNTER — Encounter: Payer: BLUE CROSS/BLUE SHIELD | Admitting: Occupational Therapy

## 2018-07-07 ENCOUNTER — Ambulatory Visit: Payer: BLUE CROSS/BLUE SHIELD | Admitting: Physical Therapy

## 2018-07-07 ENCOUNTER — Encounter: Payer: BLUE CROSS/BLUE SHIELD | Admitting: Occupational Therapy

## 2018-07-08 ENCOUNTER — Telehealth: Payer: Self-pay | Admitting: *Deleted

## 2018-07-08 ENCOUNTER — Encounter: Payer: Self-pay | Admitting: Internal Medicine

## 2018-07-08 NOTE — Telephone Encounter (Signed)
-----   Message from Iva Boop, MD sent at 07/08/2018  9:16 AM EDT ----- Regarding: please call patient Please call her and explain my plan for bowel regimen as outlined in my assessment and plan  If she asks - I do not have particular expertise in this area and this is best I can do  I do not think that the Trulance or erythromycin will work very well in her so would stop  She may have better luck with her PM and R doc or if it remains a severe problem she could need to have a colostomy - would need to see surgeon

## 2018-07-08 NOTE — Telephone Encounter (Signed)
I left a message for the patient to call back 

## 2018-07-10 ENCOUNTER — Ambulatory Visit: Payer: BLUE CROSS/BLUE SHIELD | Admitting: Physical Therapy

## 2018-07-10 ENCOUNTER — Encounter: Payer: BLUE CROSS/BLUE SHIELD | Admitting: Occupational Therapy

## 2018-07-15 ENCOUNTER — Telehealth: Payer: Self-pay | Admitting: Internal Medicine

## 2018-07-15 NOTE — Telephone Encounter (Signed)
-----   Message from Iva Boop, MD sent at 07/08/2018  9:16 AM EDT ----- Regarding: please call patient Please call her and explain my plan for bowel regimen as outlined in my assessment and plan   DC erythromycin, Trulance  Do following   senokot-s 2 tabs at HS with 10mg  dulcolax suppository in AM.  If this isn't effective, try miralax at night  If necessary add a fleet enema in the AM.    If she asks - I do not have particular expertise in this area and this is best I can do  I do not think that the Trulance or erythromycin will work very well in her so would stop  She may have better luck with her PM and R doc or if it remains a severe problem she could need to have a colostomy - would need to see surgeon      See above instructions from Dr. Leone Payor

## 2018-07-16 NOTE — Telephone Encounter (Signed)
Patient notified of the recommendations from previous phone call with Dr. Leone Payor.  She is aware and will try this regimen.

## 2018-07-21 ENCOUNTER — Other Ambulatory Visit: Payer: Self-pay | Admitting: Obstetrics and Gynecology

## 2018-07-21 DIAGNOSIS — Z1231 Encounter for screening mammogram for malignant neoplasm of breast: Secondary | ICD-10-CM

## 2018-07-22 ENCOUNTER — Encounter: Payer: BLUE CROSS/BLUE SHIELD | Admitting: Occupational Therapy

## 2018-07-22 ENCOUNTER — Ambulatory Visit: Payer: BLUE CROSS/BLUE SHIELD | Admitting: Physical Therapy

## 2018-07-24 ENCOUNTER — Ambulatory Visit: Payer: BLUE CROSS/BLUE SHIELD | Admitting: Physical Therapy

## 2018-07-24 ENCOUNTER — Encounter: Payer: BLUE CROSS/BLUE SHIELD | Admitting: Occupational Therapy

## 2018-07-29 ENCOUNTER — Other Ambulatory Visit: Payer: Self-pay

## 2018-07-29 ENCOUNTER — Ambulatory Visit: Payer: BLUE CROSS/BLUE SHIELD | Admitting: Internal Medicine

## 2018-07-30 ENCOUNTER — Encounter: Payer: BLUE CROSS/BLUE SHIELD | Admitting: Occupational Therapy

## 2018-07-30 ENCOUNTER — Ambulatory Visit: Payer: BLUE CROSS/BLUE SHIELD | Admitting: Physical Therapy

## 2018-08-01 ENCOUNTER — Encounter: Payer: BLUE CROSS/BLUE SHIELD | Admitting: Occupational Therapy

## 2018-08-01 ENCOUNTER — Ambulatory Visit: Payer: BLUE CROSS/BLUE SHIELD | Admitting: Physical Therapy

## 2018-08-04 ENCOUNTER — Encounter: Payer: BLUE CROSS/BLUE SHIELD | Admitting: Occupational Therapy

## 2018-08-04 ENCOUNTER — Ambulatory Visit: Payer: BLUE CROSS/BLUE SHIELD | Admitting: Physical Therapy

## 2018-08-07 ENCOUNTER — Encounter: Payer: BLUE CROSS/BLUE SHIELD | Admitting: Occupational Therapy

## 2018-08-07 ENCOUNTER — Ambulatory Visit: Payer: BLUE CROSS/BLUE SHIELD | Admitting: Physical Therapy

## 2018-08-11 ENCOUNTER — Encounter: Payer: BLUE CROSS/BLUE SHIELD | Admitting: Occupational Therapy

## 2018-08-11 ENCOUNTER — Ambulatory Visit: Payer: BLUE CROSS/BLUE SHIELD | Admitting: Physical Therapy

## 2018-08-14 ENCOUNTER — Encounter: Payer: BLUE CROSS/BLUE SHIELD | Admitting: Occupational Therapy

## 2018-08-14 ENCOUNTER — Ambulatory Visit: Payer: BLUE CROSS/BLUE SHIELD | Admitting: Physical Therapy

## 2018-11-26 ENCOUNTER — Other Ambulatory Visit: Payer: Self-pay | Admitting: Obstetrics and Gynecology

## 2018-11-26 DIAGNOSIS — N6320 Unspecified lump in the left breast, unspecified quadrant: Secondary | ICD-10-CM

## 2018-12-05 ENCOUNTER — Other Ambulatory Visit: Payer: BLUE CROSS/BLUE SHIELD

## 2018-12-05 ENCOUNTER — Inpatient Hospital Stay: Admission: RE | Admit: 2018-12-05 | Payer: BLUE CROSS/BLUE SHIELD | Source: Ambulatory Visit

## 2018-12-09 ENCOUNTER — Ambulatory Visit: Payer: Self-pay

## 2018-12-09 ENCOUNTER — Other Ambulatory Visit: Payer: Self-pay

## 2018-12-09 ENCOUNTER — Ambulatory Visit
Admission: RE | Admit: 2018-12-09 | Discharge: 2018-12-09 | Disposition: A | Discharge status: Home / Self Care | Payer: BC Managed Care – PPO | Source: Ambulatory Visit | Attending: Obstetrics and Gynecology | Admitting: Obstetrics and Gynecology

## 2018-12-09 DIAGNOSIS — N6320 Unspecified lump in the left breast, unspecified quadrant: Secondary | ICD-10-CM

## 2018-12-10 ENCOUNTER — Ambulatory Visit: Payer: BLUE CROSS/BLUE SHIELD

## 2019-02-14 ENCOUNTER — Inpatient Hospital Stay (HOSPITAL_BASED_OUTPATIENT_CLINIC_OR_DEPARTMENT_OTHER)
Admission: EM | Admit: 2019-02-14 | Discharge: 2019-03-05 | DRG: 357 | Disposition: A | Discharge status: Home Health | Payer: BC Managed Care – PPO | Attending: Internal Medicine | Admitting: Internal Medicine

## 2019-02-14 ENCOUNTER — Emergency Department (HOSPITAL_BASED_OUTPATIENT_CLINIC_OR_DEPARTMENT_OTHER): Payer: BC Managed Care – PPO

## 2019-02-14 ENCOUNTER — Other Ambulatory Visit: Payer: Self-pay

## 2019-02-14 ENCOUNTER — Encounter (HOSPITAL_BASED_OUTPATIENT_CLINIC_OR_DEPARTMENT_OTHER): Payer: Self-pay | Admitting: Adult Health

## 2019-02-14 DIAGNOSIS — Z86718 Personal history of other venous thrombosis and embolism: Secondary | ICD-10-CM

## 2019-02-14 DIAGNOSIS — F900 Attention-deficit hyperactivity disorder, predominantly inattentive type: Secondary | ICD-10-CM | POA: Diagnosis present

## 2019-02-14 DIAGNOSIS — F419 Anxiety disorder, unspecified: Secondary | ICD-10-CM | POA: Diagnosis present

## 2019-02-14 DIAGNOSIS — E878 Other disorders of electrolyte and fluid balance, not elsewhere classified: Secondary | ICD-10-CM | POA: Diagnosis not present

## 2019-02-14 DIAGNOSIS — N390 Urinary tract infection, site not specified: Secondary | ICD-10-CM | POA: Diagnosis present

## 2019-02-14 DIAGNOSIS — E872 Acidosis: Secondary | ICD-10-CM | POA: Diagnosis not present

## 2019-02-14 DIAGNOSIS — F3181 Bipolar II disorder: Secondary | ICD-10-CM | POA: Diagnosis present

## 2019-02-14 DIAGNOSIS — I959 Hypotension, unspecified: Secondary | ICD-10-CM | POA: Diagnosis not present

## 2019-02-14 DIAGNOSIS — R109 Unspecified abdominal pain: Secondary | ICD-10-CM | POA: Diagnosis not present

## 2019-02-14 DIAGNOSIS — G8220 Paraplegia, unspecified: Secondary | ICD-10-CM | POA: Diagnosis present

## 2019-02-14 DIAGNOSIS — Z933 Colostomy status: Secondary | ICD-10-CM | POA: Diagnosis not present

## 2019-02-14 DIAGNOSIS — Z20828 Contact with and (suspected) exposure to other viral communicable diseases: Secondary | ICD-10-CM | POA: Diagnosis present

## 2019-02-14 DIAGNOSIS — K219 Gastro-esophageal reflux disease without esophagitis: Secondary | ICD-10-CM | POA: Diagnosis present

## 2019-02-14 DIAGNOSIS — Z9359 Other cystostomy status: Secondary | ICD-10-CM | POA: Diagnosis not present

## 2019-02-14 DIAGNOSIS — Z0189 Encounter for other specified special examinations: Secondary | ICD-10-CM

## 2019-02-14 DIAGNOSIS — E222 Syndrome of inappropriate secretion of antidiuretic hormone: Secondary | ICD-10-CM | POA: Diagnosis not present

## 2019-02-14 DIAGNOSIS — R140 Abdominal distension (gaseous): Secondary | ICD-10-CM | POA: Diagnosis not present

## 2019-02-14 DIAGNOSIS — R112 Nausea with vomiting, unspecified: Secondary | ICD-10-CM

## 2019-02-14 DIAGNOSIS — S24103S Unspecified injury at T7-T10 level of thoracic spinal cord, sequela: Secondary | ICD-10-CM | POA: Diagnosis not present

## 2019-02-14 DIAGNOSIS — Z981 Arthrodesis status: Secondary | ICD-10-CM | POA: Diagnosis not present

## 2019-02-14 DIAGNOSIS — Z800 Family history of malignant neoplasm of digestive organs: Secondary | ICD-10-CM

## 2019-02-14 DIAGNOSIS — N200 Calculus of kidney: Secondary | ICD-10-CM | POA: Diagnosis present

## 2019-02-14 DIAGNOSIS — K450 Other specified abdominal hernia with obstruction, without gangrene: Secondary | ICD-10-CM | POA: Diagnosis present

## 2019-02-14 DIAGNOSIS — E876 Hypokalemia: Secondary | ICD-10-CM | POA: Diagnosis present

## 2019-02-14 DIAGNOSIS — G8310 Monoplegia of lower limb affecting unspecified side: Secondary | ICD-10-CM | POA: Diagnosis not present

## 2019-02-14 DIAGNOSIS — L8942 Pressure ulcer of contiguous site of back, buttock and hip, stage 2: Secondary | ICD-10-CM

## 2019-02-14 DIAGNOSIS — Z888 Allergy status to other drugs, medicaments and biological substances status: Secondary | ICD-10-CM

## 2019-02-14 DIAGNOSIS — K5651 Intestinal adhesions [bands], with partial obstruction: Secondary | ICD-10-CM | POA: Diagnosis present

## 2019-02-14 DIAGNOSIS — B957 Other staphylococcus as the cause of diseases classified elsewhere: Secondary | ICD-10-CM | POA: Diagnosis not present

## 2019-02-14 DIAGNOSIS — Z66 Do not resuscitate: Secondary | ICD-10-CM | POA: Diagnosis present

## 2019-02-14 DIAGNOSIS — K56609 Unspecified intestinal obstruction, unspecified as to partial versus complete obstruction: Secondary | ICD-10-CM | POA: Diagnosis present

## 2019-02-14 DIAGNOSIS — R8271 Bacteriuria: Secondary | ICD-10-CM | POA: Diagnosis not present

## 2019-02-14 DIAGNOSIS — N319 Neuromuscular dysfunction of bladder, unspecified: Secondary | ICD-10-CM | POA: Diagnosis present

## 2019-02-14 DIAGNOSIS — L8990 Pressure ulcer of unspecified site, unspecified stage: Secondary | ICD-10-CM | POA: Insufficient documentation

## 2019-02-14 DIAGNOSIS — L89152 Pressure ulcer of sacral region, stage 2: Secondary | ICD-10-CM | POA: Diagnosis not present

## 2019-02-14 DIAGNOSIS — K592 Neurogenic bowel, not elsewhere classified: Secondary | ICD-10-CM | POA: Diagnosis present

## 2019-02-14 DIAGNOSIS — Z9111 Patient's noncompliance with dietary regimen: Secondary | ICD-10-CM

## 2019-02-14 DIAGNOSIS — G950 Syringomyelia and syringobulbia: Secondary | ICD-10-CM | POA: Diagnosis not present

## 2019-02-14 DIAGNOSIS — G629 Polyneuropathy, unspecified: Secondary | ICD-10-CM | POA: Diagnosis present

## 2019-02-14 DIAGNOSIS — Z87440 Personal history of urinary (tract) infections: Secondary | ICD-10-CM

## 2019-02-14 DIAGNOSIS — Z90710 Acquired absence of both cervix and uterus: Secondary | ICD-10-CM

## 2019-02-14 DIAGNOSIS — Z79899 Other long term (current) drug therapy: Secondary | ICD-10-CM

## 2019-02-14 DIAGNOSIS — Z4659 Encounter for fitting and adjustment of other gastrointestinal appliance and device: Secondary | ICD-10-CM

## 2019-02-14 DIAGNOSIS — Z880 Allergy status to penicillin: Secondary | ICD-10-CM

## 2019-02-14 DIAGNOSIS — E871 Hypo-osmolality and hyponatremia: Secondary | ICD-10-CM | POA: Diagnosis not present

## 2019-02-14 DIAGNOSIS — R52 Pain, unspecified: Secondary | ICD-10-CM

## 2019-02-14 LAB — URINALYSIS, MICROSCOPIC (REFLEX)

## 2019-02-14 LAB — CBC
HCT: 46.8 % — ABNORMAL HIGH (ref 36.0–46.0)
Hemoglobin: 14.5 g/dL (ref 12.0–15.0)
MCH: 28.9 pg (ref 26.0–34.0)
MCHC: 31 g/dL (ref 30.0–36.0)
MCV: 93.4 fL (ref 80.0–100.0)
Platelets: 416 10*3/uL — ABNORMAL HIGH (ref 150–400)
RBC: 5.01 MIL/uL (ref 3.87–5.11)
RDW: 14.9 % (ref 11.5–15.5)
WBC: 12.3 10*3/uL — ABNORMAL HIGH (ref 4.0–10.5)
nRBC: 0 % (ref 0.0–0.2)

## 2019-02-14 LAB — COMPREHENSIVE METABOLIC PANEL
ALT: 23 U/L (ref 0–44)
AST: 22 U/L (ref 15–41)
Albumin: 4.2 g/dL (ref 3.5–5.0)
Alkaline Phosphatase: 132 U/L — ABNORMAL HIGH (ref 38–126)
Anion gap: 9 (ref 5–15)
BUN: 10 mg/dL (ref 6–20)
CO2: 27 mmol/L (ref 22–32)
Calcium: 9.9 mg/dL (ref 8.9–10.3)
Chloride: 101 mmol/L (ref 98–111)
Creatinine, Ser: 0.62 mg/dL (ref 0.44–1.00)
GFR calc Af Amer: >60 mL/min (ref >60)
GFR calc non Af Amer: >60 mL/min (ref >60)
Glucose, Bld: 102 mg/dL — ABNORMAL HIGH (ref 70–99)
Potassium: 3.2 mmol/L — ABNORMAL LOW (ref 3.5–5.1)
Sodium: 137 mmol/L (ref 135–145)
Total Bilirubin: 0.4 mg/dL (ref 0.3–1.2)
Total Protein: 8.3 g/dL — ABNORMAL HIGH (ref 6.5–8.1)

## 2019-02-14 LAB — URINALYSIS, ROUTINE W REFLEX MICROSCOPIC
Bilirubin Urine: NEGATIVE
Glucose, UA: NEGATIVE mg/dL
Hgb urine dipstick: NEGATIVE
Ketones, ur: NEGATIVE mg/dL
Nitrite: NEGATIVE
Protein, ur: NEGATIVE mg/dL
Specific Gravity, Urine: 1.01 (ref 1.005–1.030)
pH: 7.5 (ref 5.0–8.0)

## 2019-02-14 LAB — PREGNANCY, URINE: Preg Test, Ur: NEGATIVE

## 2019-02-14 LAB — LIPASE, BLOOD: Lipase: 26 U/L (ref 11–51)

## 2019-02-14 MED ORDER — ONDANSETRON HCL 4 MG/2ML IJ SOLN
4.0000 mg | Freq: Once | INTRAMUSCULAR | Status: AC
  Administered 2019-02-14: 22:00:00 4 mg via INTRAVENOUS

## 2019-02-14 MED ORDER — HYDROMORPHONE HCL 1 MG/ML IJ SOLN
1.0000 mg | Freq: Once | INTRAMUSCULAR | Status: AC
  Administered 2019-02-14: 22:00:00 1 mg via INTRAVENOUS
  Filled 2019-02-14: qty 1

## 2019-02-14 MED ORDER — SODIUM CHLORIDE 0.9% FLUSH
3.0000 mL | Freq: Once | INTRAVENOUS | Status: DC
  Filled 2019-02-14: qty 3

## 2019-02-14 MED ORDER — MORPHINE SULFATE (PF) 4 MG/ML IV SOLN
4.0000 mg | Freq: Once | INTRAVENOUS | Status: AC
  Administered 2019-02-14: 4 mg via INTRAVENOUS
  Filled 2019-02-14: qty 1

## 2019-02-14 MED ORDER — SODIUM CHLORIDE 0.9 % IV BOLUS
500.0000 mL | Freq: Once | INTRAVENOUS | Status: AC
  Administered 2019-02-14: 500 mL via INTRAVENOUS

## 2019-02-14 MED ORDER — ONDANSETRON HCL 4 MG/2ML IJ SOLN
INTRAMUSCULAR | Status: AC
  Filled 2019-02-14: qty 2
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
## 2019-02-14 NOTE — ED Triage Notes (Signed)
Ashley Gentry is a paraplegic who presents with an indwelling foley catheter, she has been experiencing problems over the past few weeks with her foley. She was seen by her urologist Friday and told she has some calcifications in her bladder. She also had a colostomy placed last month and for the past 2-3 days she had not had any output. She is unsure where the pain is coming from but she is having abdominal cramping. She is not sure if it is bladder or colostomy. Her foley bag is draining yellow, clear urine. She says her abdomen is very distended. She is currently on Cipro.

## 2019-02-14 NOTE — ED Notes (Signed)
[facility] arrived to transfer pt to [facility] 

## 2019-02-14 NOTE — ED Notes (Signed)
ED Provider at bedside. 

## 2019-02-14 NOTE — ED Notes (Signed)
Pt informed RN she will leave her personal wheelchair and her dad or boyfriend will pick up.

## 2019-02-14 NOTE — ED Notes (Signed)
[facility] notified (Tammy) - patient ready for transport 

## 2019-02-14 NOTE — Plan of Care (Signed)
43 yo F w hx of paraplegia here with SBO On CT scan Covid pending Gen Surgery Dr. Hart Robinsons aware  Vitals and Lab work unremarkable  Accepted to [place] floor  Ashley Gentry 8:40 PM

## 2019-02-14 NOTE — ED Notes (Signed)
Patient transported to CT 

## 2019-02-14 NOTE — ED Notes (Signed)
Pt returned from CT °

## 2019-02-15 ENCOUNTER — Inpatient Hospital Stay (HOSPITAL_COMMUNITY): Payer: BC Managed Care – PPO

## 2019-02-15 DIAGNOSIS — G8220 Paraplegia, unspecified: Secondary | ICD-10-CM

## 2019-02-15 DIAGNOSIS — E876 Hypokalemia: Secondary | ICD-10-CM

## 2019-02-15 DIAGNOSIS — N319 Neuromuscular dysfunction of bladder, unspecified: Secondary | ICD-10-CM

## 2019-02-15 DIAGNOSIS — R7889 Finding of other specified substances, not normally found in blood: Secondary | ICD-10-CM

## 2019-02-15 DIAGNOSIS — K219 Gastro-esophageal reflux disease without esophagitis: Secondary | ICD-10-CM

## 2019-02-15 DIAGNOSIS — Z933 Colostomy status: Secondary | ICD-10-CM

## 2019-02-15 DIAGNOSIS — F900 Attention-deficit hyperactivity disorder, predominantly inattentive type: Secondary | ICD-10-CM

## 2019-02-15 LAB — LITHIUM LEVEL: Lithium Lvl: 0.07 mmol/L — ABNORMAL LOW (ref 0.60–1.20)

## 2019-02-15 LAB — COMPREHENSIVE METABOLIC PANEL
ALT: 20 U/L (ref 0–44)
AST: 19 U/L (ref 15–41)
Albumin: 3.9 g/dL (ref 3.5–5.0)
Alkaline Phosphatase: 121 U/L (ref 38–126)
Anion gap: 10 (ref 5–15)
BUN: 7 mg/dL (ref 6–20)
CO2: 26 mmol/L (ref 22–32)
Calcium: 9.8 mg/dL (ref 8.9–10.3)
Chloride: 103 mmol/L (ref 98–111)
Creatinine, Ser: 0.56 mg/dL (ref 0.44–1.00)
GFR calc Af Amer: >60 mL/min (ref >60)
GFR calc non Af Amer: >60 mL/min (ref >60)
Glucose, Bld: 102 mg/dL — ABNORMAL HIGH (ref 70–99)
Potassium: 2.7 mmol/L — CL (ref 3.5–5.1)
Sodium: 139 mmol/L (ref 135–145)
Total Bilirubin: 0.5 mg/dL (ref 0.3–1.2)
Total Protein: 7.4 g/dL (ref 6.5–8.1)

## 2019-02-15 LAB — CBC
HCT: 43.4 % (ref 36.0–46.0)
Hemoglobin: 13.2 g/dL (ref 12.0–15.0)
MCH: 28.8 pg (ref 26.0–34.0)
MCHC: 30.4 g/dL (ref 30.0–36.0)
MCV: 94.6 fL (ref 80.0–100.0)
Platelets: 378 10*3/uL (ref 150–400)
RBC: 4.59 MIL/uL (ref 3.87–5.11)
RDW: 14.8 % (ref 11.5–15.5)
WBC: 9.5 10*3/uL (ref 4.0–10.5)
nRBC: 0 % (ref 0.0–0.2)

## 2019-02-15 LAB — SARS CORONAVIRUS 2 (TAT 6-24 HRS): SARS Coronavirus 2: NEGATIVE

## 2019-02-15 LAB — HIV ANTIBODY (ROUTINE TESTING W REFLEX): HIV Screen 4th Generation wRfx: NONREACTIVE

## 2019-02-15 LAB — MAGNESIUM: Magnesium: 2.3 mg/dL (ref 1.7–2.4)

## 2019-02-15 MED ORDER — METHOCARBAMOL 500 MG PO TABS
500.0000 mg | ORAL_TABLET | Freq: Four times a day (QID) | ORAL | Status: DC | PRN
  Administered 2019-02-17 – 2019-02-23 (×3): 500 mg via ORAL
  Filled 2019-02-15 (×5): qty 1

## 2019-02-15 MED ORDER — ERYTHROMYCIN BASE 250 MG PO TABS
250.0000 mg | ORAL_TABLET | Freq: Every day | ORAL | Status: DC
  Administered 2019-02-15: 12:00:00 250 mg via ORAL
  Filled 2019-02-15: qty 1

## 2019-02-15 MED ORDER — DIPHENHYDRAMINE HCL 25 MG PO CAPS
25.0000 mg | ORAL_CAPSULE | Freq: Once | ORAL | Status: DC
  Filled 2019-02-15 (×2): qty 1

## 2019-02-15 MED ORDER — POTASSIUM CHLORIDE IN NACL 20-0.9 MEQ/L-% IV SOLN
INTRAVENOUS | Status: DC
  Administered 2019-02-15: 17:00:00 via INTRAVENOUS
  Filled 2019-02-15 (×2): qty 1000

## 2019-02-15 MED ORDER — BACLOFEN 10 MG PO TABS
10.0000 mg | ORAL_TABLET | Freq: Three times a day (TID) | ORAL | Status: DC
  Administered 2019-02-15 – 2019-02-18 (×5): 10 mg via ORAL
  Filled 2019-02-15 (×8): qty 1

## 2019-02-15 MED ORDER — ALUM & MAG HYDROXIDE-SIMETH 200-200-20 MG/5ML PO SUSP
30.0000 mL | Freq: Three times a day (TID) | ORAL | Status: DC | PRN
  Administered 2019-02-15: 30 mL via ORAL
  Filled 2019-02-15 (×2): qty 30

## 2019-02-15 MED ORDER — POTASSIUM CHLORIDE CRYS ER 20 MEQ PO TBCR
40.0000 meq | EXTENDED_RELEASE_TABLET | Freq: Once | ORAL | Status: AC
  Administered 2019-02-15: 06:00:00 40 meq via ORAL
  Filled 2019-02-15: qty 2

## 2019-02-15 MED ORDER — LITHIUM CARBONATE ER 300 MG PO TBCR
300.0000 mg | EXTENDED_RELEASE_TABLET | Freq: Every day | ORAL | Status: DC
  Administered 2019-02-16 – 2019-03-04 (×15): 300 mg via ORAL
  Filled 2019-02-15 (×19): qty 1

## 2019-02-15 MED ORDER — HYDROMORPHONE HCL 1 MG/ML IJ SOLN
0.5000 mg | INTRAMUSCULAR | Status: DC | PRN
  Administered 2019-02-15 – 2019-02-19 (×15): 0.5 mg via INTRAVENOUS
  Filled 2019-02-15 (×15): qty 0.5

## 2019-02-15 MED ORDER — DULOXETINE HCL 30 MG PO CPEP
30.0000 mg | ORAL_CAPSULE | Freq: Every day | ORAL | Status: DC
  Administered 2019-02-15 – 2019-02-18 (×2): 30 mg via ORAL
  Filled 2019-02-15 (×4): qty 1

## 2019-02-15 MED ORDER — METHYLPHENIDATE HCL ER 18 MG PO TB24
54.0000 mg | ORAL_TABLET | Freq: Every day | ORAL | Status: DC
  Administered 2019-02-17: 08:00:00 36 mg via ORAL
  Administered 2019-02-18: 08:00:00 54 mg via ORAL
  Filled 2019-02-15 (×4): qty 3

## 2019-02-15 MED ORDER — NORTRIPTYLINE HCL 25 MG PO CAPS
25.0000 mg | ORAL_CAPSULE | Freq: Every day | ORAL | Status: DC
  Filled 2019-02-15 (×5): qty 1

## 2019-02-15 MED ORDER — DIATRIZOATE MEGLUMINE & SODIUM 66-10 % PO SOLN
90.0000 mL | Freq: Once | ORAL | Status: AC
  Administered 2019-02-15: 90 mL via NASOGASTRIC
  Filled 2019-02-15: qty 90

## 2019-02-15 MED ORDER — QUETIAPINE FUMARATE 100 MG PO TABS
200.0000 mg | ORAL_TABLET | Freq: Every day | ORAL | Status: DC
  Administered 2019-02-16 – 2019-03-04 (×16): 200 mg via ORAL
  Filled 2019-02-15 (×18): qty 2

## 2019-02-15 MED ORDER — DOCUSATE SODIUM 100 MG PO CAPS
100.0000 mg | ORAL_CAPSULE | Freq: Every day | ORAL | Status: DC
  Administered 2019-02-15 – 2019-02-18 (×3): 100 mg via ORAL
  Filled 2019-02-15 (×3): qty 1

## 2019-02-15 MED ORDER — CIPROFLOXACIN HCL 500 MG PO TABS
500.0000 mg | ORAL_TABLET | Freq: Two times a day (BID) | ORAL | Status: DC
  Filled 2019-02-15: qty 1

## 2019-02-15 MED ORDER — PREGABALIN 100 MG PO CAPS
200.0000 mg | ORAL_CAPSULE | Freq: Three times a day (TID) | ORAL | Status: DC
  Administered 2019-02-15 – 2019-02-23 (×13): 200 mg via ORAL
  Administered 2019-02-24: 100 mg via ORAL
  Administered 2019-02-24 – 2019-03-05 (×21): 200 mg via ORAL
  Filled 2019-02-15: qty 4
  Filled 2019-02-15 (×4): qty 2
  Filled 2019-02-15: qty 4
  Filled 2019-02-15 (×7): qty 2
  Filled 2019-02-15: qty 4
  Filled 2019-02-15 (×7): qty 2
  Filled 2019-02-15: qty 4
  Filled 2019-02-15 (×2): qty 2
  Filled 2019-02-15: qty 4
  Filled 2019-02-15 (×4): qty 2
  Filled 2019-02-15 (×4): qty 4
  Filled 2019-02-15 (×6): qty 2
  Filled 2019-02-15: qty 4
  Filled 2019-02-15: qty 2
  Filled 2019-02-15: qty 4
  Filled 2019-02-15 (×4): qty 2

## 2019-02-15 MED ORDER — PROMETHAZINE HCL 25 MG/ML IJ SOLN
12.5000 mg | Freq: Four times a day (QID) | INTRAMUSCULAR | Status: DC | PRN
  Administered 2019-02-15 – 2019-02-19 (×9): 12.5 mg via INTRAVENOUS
  Filled 2019-02-15 (×8): qty 1

## 2019-02-15 MED ORDER — ENOXAPARIN SODIUM 40 MG/0.4ML ~~LOC~~ SOLN
40.0000 mg | Freq: Every day | SUBCUTANEOUS | Status: DC
  Administered 2019-02-15 – 2019-02-21 (×7): 40 mg via SUBCUTANEOUS
  Filled 2019-02-15 (×8): qty 0.4

## 2019-02-15 MED ORDER — BREXPIPRAZOLE 2 MG PO TABS
2.0000 mg | ORAL_TABLET | Freq: Every day | ORAL | Status: DC
  Administered 2019-02-15: 12:00:00 2 mg via ORAL
  Filled 2019-02-15 (×5): qty 1

## 2019-02-15 MED ORDER — ACETAMINOPHEN 650 MG RE SUPP: 650.0000 mg | Freq: Four times a day (QID) | RECTAL | Status: DC | PRN

## 2019-02-15 MED ORDER — PHENOL 1.4 % MT LIQD
1.0000 | OROMUCOSAL | Status: DC | PRN
  Administered 2019-02-15 – 2019-02-16 (×2): 1 via OROMUCOSAL
  Filled 2019-02-15: qty 177

## 2019-02-15 MED ORDER — BISACODYL 10 MG RE SUPP
10.0000 mg | Freq: Every day | RECTAL | Status: DC
  Administered 2019-02-18: 10 mg via RECTAL
  Filled 2019-02-15 (×4): qty 1

## 2019-02-15 MED ORDER — ACETAMINOPHEN 325 MG PO TABS
650.0000 mg | ORAL_TABLET | Freq: Four times a day (QID) | ORAL | Status: DC | PRN
  Administered 2019-02-17: 650 mg via ORAL
  Filled 2019-02-15 (×2): qty 2

## 2019-02-15 MED ORDER — TIZANIDINE HCL 4 MG PO TABS
4.0000 mg | ORAL_TABLET | Freq: Three times a day (TID) | ORAL | Status: DC
  Administered 2019-02-15 – 2019-02-18 (×6): 4 mg via ORAL
  Filled 2019-02-15 (×8): qty 1

## 2019-02-15 MED ORDER — POTASSIUM CHLORIDE CRYS ER 20 MEQ PO TBCR
40.0000 meq | EXTENDED_RELEASE_TABLET | ORAL | Status: AC
  Administered 2019-02-15: 09:00:00 40 meq via ORAL
  Filled 2019-02-15: qty 2

## 2019-02-15 MED ORDER — ONDANSETRON HCL 4 MG/2ML IJ SOLN
4.0000 mg | Freq: Four times a day (QID) | INTRAMUSCULAR | Status: DC | PRN
  Administered 2019-02-15 – 2019-02-19 (×13): 4 mg via INTRAVENOUS
  Filled 2019-02-15 (×15): qty 2

## 2019-02-15 MED ORDER — PANTOPRAZOLE SODIUM 40 MG PO TBEC
40.0000 mg | DELAYED_RELEASE_TABLET | Freq: Every day | ORAL | Status: DC
  Administered 2019-02-15: 12:00:00 40 mg via ORAL
  Filled 2019-02-15: qty 1

## 2019-02-15 MED ORDER — LAMOTRIGINE 100 MG PO TABS
200.0000 mg | ORAL_TABLET | Freq: Every day | ORAL | Status: DC
  Administered 2019-02-15 – 2019-03-05 (×16): 200 mg via ORAL
  Filled 2019-02-15 (×20): qty 2

## 2019-02-15 MED ORDER — POTASSIUM CHLORIDE IN NACL 20-0.9 MEQ/L-% IV SOLN
INTRAVENOUS | Status: AC
  Administered 2019-02-15: 02:00:00 via INTRAVENOUS
  Filled 2019-02-15 (×2): qty 1000

## 2019-02-15 NOTE — Progress Notes (Addendum)
Patient with high anxiety voicing complaints of nausea and not feeling well. Has voiced concerns throughout the day about her medications, although they have been addressed by her providers and pharmacy, as well as an MRI, which is being looked into by her provider today. Now with complaints of emesis x1; anxious due to fear of worsening spinal cord injury due to vomiting and wretching per patient. IV Zofran, Phenergan, and PO Maalox given previously with little to no improvement in symptoms. Abdomen with worsening distention; patient endorses not feeling well. Physician on call notified of abdominal distention; informed that gastrograffin was administered this morning around 0915 and abdominal xray will be done 8 hours after that, so roughly around 1715 this evening. He feels she will probably need an NG tube but advises to notify surgery and see how they wish to proceed given the most recent circumstances. Surgery on call notified. Will await call back.   Spoke with Dr. Redmond Pulling who said from surgical standpoint recommendation would still be for NG tube placement for decompression but she was not agreeable to that course of treatment this morning. Will give NG tube option to patient again and see if she is agreeable to this treatment.

## 2019-02-15 NOTE — Progress Notes (Signed)
Patient has really high anxiety level currently, patient wants to take Seroquel that she takes at home along with other home meds. Notified MD on call of patient's request and patient will get her routine meds starting this AM. Patient has critical potassium of 2.7, patient is stable and currently being monitored, MD on call notified, order placed for PO supplement. Dawson Bills, RN

## 2019-02-15 NOTE — Plan of Care (Signed)
Patient lying in bed this morning; complains of nausea and abdominal distention. Will continue to monitor.
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
## 2019-02-15 NOTE — Progress Notes (Signed)
Paged Jeannette Corpus about admin of 2200 PO medications since now has NG tube. Instructions to hold 2200 PO meds.
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
## 2019-02-16 ENCOUNTER — Inpatient Hospital Stay (HOSPITAL_COMMUNITY): Payer: BC Managed Care – PPO

## 2019-02-16 DIAGNOSIS — E872 Acidosis: Secondary | ICD-10-CM

## 2019-02-16 LAB — BASIC METABOLIC PANEL
Anion gap: 7 (ref 5–15)
BUN: 5 mg/dL — ABNORMAL LOW (ref 6–20)
CO2: 21 mmol/L — ABNORMAL LOW (ref 22–32)
Calcium: 8.6 mg/dL — ABNORMAL LOW (ref 8.9–10.3)
Chloride: 111 mmol/L (ref 98–111)
Creatinine, Ser: 0.51 mg/dL (ref 0.44–1.00)
GFR calc Af Amer: >60 mL/min (ref >60)
GFR calc non Af Amer: >60 mL/min (ref >60)
Glucose, Bld: 93 mg/dL (ref 70–99)
Potassium: 4 mmol/L (ref 3.5–5.1)
Sodium: 139 mmol/L (ref 135–145)

## 2019-02-16 LAB — MAGNESIUM: Magnesium: 2 mg/dL (ref 1.7–2.4)

## 2019-02-16 MED ORDER — PANTOPRAZOLE SODIUM 40 MG IV SOLR
40.0000 mg | INTRAVENOUS | Status: DC
  Administered 2019-02-16 – 2019-03-02 (×13): 40 mg via INTRAVENOUS
  Filled 2019-02-16 (×14): qty 40

## 2019-02-16 MED ORDER — MORPHINE SULFATE (PF) 2 MG/ML IV SOLN
2.0000 mg | Freq: Once | INTRAVENOUS | Status: AC
  Administered 2019-02-16: 07:00:00 2 mg via INTRAVENOUS
  Filled 2019-02-16: qty 1

## 2019-02-16 MED ORDER — LACTATED RINGERS IV SOLN
INTRAVENOUS | Status: DC
  Administered 2019-02-16 – 2019-02-17 (×2): via INTRAVENOUS

## 2019-02-16 MED ORDER — CIPROFLOXACIN IN D5W 400 MG/200ML IV SOLN
400.0000 mg | Freq: Two times a day (BID) | INTRAVENOUS | Status: DC
  Administered 2019-02-16: 11:00:00 400 mg via INTRAVENOUS
  Filled 2019-02-16: qty 200

## 2019-02-16 MED ORDER — DIATRIZOATE MEGLUMINE & SODIUM 66-10 % PO SOLN
90.0000 mL | Freq: Once | ORAL | Status: AC
  Administered 2019-02-16: 11:00:00 90 mL via NASOGASTRIC
  Filled 2019-02-16: qty 90

## 2019-02-16 NOTE — Progress Notes (Signed)
Pt has some stool in her colostomy bag this evening.  Only pain she is complaining of is a headache and her throat.  Still refusing saline mouth rinse, encouraged Chloraseptic spray. She has been requesting nausea medication. Also will say she wants her pills but then does not want me to clamp her NG tube to allow time for digestion because she is afraid she will vomit.  Tried to encouraged her to try tonight to take bedtime meds.
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
## 2019-02-16 NOTE — Progress Notes (Signed)
Pt was offered the salty mouth wash gargle but refused each time.

## 2019-02-16 NOTE — Progress Notes (Signed)
Attempted to put Gastrogaffin in NG tube but pt said she could feel it and taste and would not let me proceed. I may have got one bottle in. Will leave her clamped for the hour needed in hopes of getting an xray later tonight.

## 2019-02-16 NOTE — Progress Notes (Signed)
IV fluids order ended at 0200, paged [facility] for a continuation of the order since patient has NG tube and is NPO, new orders to keep IVF [place].   Paged [facility] for something else IV for pain, an order for morphine received. Patient does not want to take the morphine because it makes her feel "weird" like she is going to pass out. Patient refuses morphine at this time because she is wanting to be awake to speak with the am doctor.   Patient would like her throat xrayed because she feels when they were putting in the NG tube a piece of something broke off. Pt given chloraseptic spray for the throat pain and reassured that there is some throat discomfort with NG tubes.   0646 Patient has filled her ostomy bag completely with stool.

## 2019-02-17 ENCOUNTER — Inpatient Hospital Stay (HOSPITAL_COMMUNITY): Payer: BC Managed Care – PPO

## 2019-02-17 DIAGNOSIS — G8310 Monoplegia of lower limb affecting unspecified side: Secondary | ICD-10-CM

## 2019-02-17 DIAGNOSIS — E871 Hypo-osmolality and hyponatremia: Secondary | ICD-10-CM

## 2019-02-17 DIAGNOSIS — Z9359 Other cystostomy status: Secondary | ICD-10-CM

## 2019-02-17 LAB — MAGNESIUM: Magnesium: 1.8 mg/dL (ref 1.7–2.4)

## 2019-02-17 LAB — COMPREHENSIVE METABOLIC PANEL
ALT: 16 U/L (ref 0–44)
AST: 16 U/L (ref 15–41)
Albumin: 3.3 g/dL — ABNORMAL LOW (ref 3.5–5.0)
Alkaline Phosphatase: 97 U/L (ref 38–126)
Anion gap: 9 (ref 5–15)
BUN: 5 mg/dL — ABNORMAL LOW (ref 6–20)
CO2: 22 mmol/L (ref 22–32)
Calcium: 8.6 mg/dL — ABNORMAL LOW (ref 8.9–10.3)
Chloride: 102 mmol/L (ref 98–111)
Creatinine, Ser: 0.42 mg/dL — ABNORMAL LOW (ref 0.44–1.00)
GFR calc Af Amer: >60 mL/min (ref >60)
GFR calc non Af Amer: >60 mL/min (ref >60)
Glucose, Bld: 84 mg/dL (ref 70–99)
Potassium: 3.2 mmol/L — ABNORMAL LOW (ref 3.5–5.1)
Sodium: 133 mmol/L — ABNORMAL LOW (ref 135–145)
Total Bilirubin: 0.9 mg/dL (ref 0.3–1.2)
Total Protein: 6.3 g/dL — ABNORMAL LOW (ref 6.5–8.1)

## 2019-02-17 LAB — URINE CULTURE: Culture: 50000 — AB

## 2019-02-17 MED ORDER — LORAZEPAM 2 MG/ML IJ SOLN
1.0000 mg | Freq: Once | INTRAMUSCULAR | Status: AC
  Administered 2019-02-18: 1 mg via INTRAVENOUS
  Filled 2019-02-17: qty 1

## 2019-02-17 MED ORDER — POLYETHYLENE GLYCOL 3350 17 G PO PACK
17.0000 g | PACK | Freq: Every day | ORAL | Status: DC
  Administered 2019-02-17 – 2019-02-18 (×2): 17 g via ORAL
  Filled 2019-02-17 (×3): qty 1

## 2019-02-17 MED ORDER — POTASSIUM CHLORIDE CRYS ER 20 MEQ PO TBCR
40.0000 meq | EXTENDED_RELEASE_TABLET | ORAL | Status: AC
  Administered 2019-02-17 (×2): 40 meq via ORAL
  Filled 2019-02-17 (×2): qty 2

## 2019-02-17 MED ORDER — POTASSIUM CHLORIDE IN NACL 20-0.9 MEQ/L-% IV SOLN
INTRAVENOUS | Status: DC
  Administered 2019-02-17: 08:00:00 via INTRAVENOUS
  Filled 2019-02-17 (×2): qty 1000

## 2019-02-17 MED ORDER — HYDROMORPHONE HCL 1 MG/ML IJ SOLN
1.0000 mg | Freq: Once | INTRAMUSCULAR | Status: AC
  Administered 2019-02-17: 1 mg via INTRAVENOUS
  Filled 2019-02-17: qty 1

## 2019-02-17 MED ORDER — POTASSIUM CHLORIDE 20 MEQ PO PACK: 40.0000 meq | PACK | ORAL | Status: DC

## 2019-02-17 NOTE — Progress Notes (Signed)
Pt reports increased abdominal discomfort.  Pt feels like she has trapped gas.  Abdominal xray shows no evidence of obstruction.  Pt given dilaudid for pain.   Pt request provider to speak with her.  Pt is requesting that MRi scheduled at [facility] be done here tomorrow. Pt is also requesting a laxative stronger than miralax.   Pharmacy has been consulted.  I will await pharmacy advice/surgery advice.
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
## 2019-02-17 NOTE — Progress Notes (Signed)
Patient is expressing pain in her lower back stating current pain regimen is not helping and that she should not be able to feel pain due to her condition. She is also expressing the need for a xray and the need to speak to pharmacy about her home medications she has not taken in 3 days. I have contacted MD and received orders for an increase in pain medication and abd xray. I have contacted pharmacy to come speak with her.

## 2019-02-17 NOTE — Progress Notes (Signed)
Patient complains of pain in back and stomach. Patient asks for miralax which I obtained orders for. Patient requesting to see or talk to MD tonight. I will make MD aware. Reported concerns to night nurse.

## 2019-02-17 NOTE — Progress Notes (Signed)
CC: Abdominal pain/constipation  Subjective: Patient is lying in bed abdomen is soft nontender.  The NG canister is only about half full.  Suctions off she has no distention or tenderness.  Positive bowel sounds.  Objective: Vital signs in last 24 hours: Temp:  [97.6 F (36.4 C)-98.7 F (37.1 C)] 98.7 F (37.1 C) (11/17 0610) Pulse Rate:  [72-98] 98 (11/17 0610) Resp:  [16-18] 16 (11/17 0610) BP: (139-143)/(78-86) 139/82 (11/17 0610) SpO2:  [96 %-99 %] 96 % (11/17 0610) Last BM Date: 02/17/19 0 p.o. recorded 1482 IV Urine 750 NG 1950 Afebrile vital signs are stable Potassium is 3.2.  Other labs are stable. P.m. film force small bowel protocol shows contrast media beyond the colon and into the left lower quadrant ostomy bag. Intake/Output from previous day: 11/16 0701 - 11/17 0700 In: 1482.9 [I.V.:1482.9] Out: 2700 [Urine:750; Emesis/NG output:1950] Intake/Output this shift: No intake/output data recorded.  General appearance: alert, cooperative and no distress GI: soft, non-tender; bowel sounds normal; no masses,  no organomegaly  Lab Results:  Recent Labs    02/14/19 1705 02/15/19 0308  WBC 12.3* 9.5  HGB 14.5 13.2  HCT 46.8* 43.4  PLT 416* 378    BMET Recent Labs    02/16/19 0334 02/17/19 0358  NA 139 133*  K 4.0 3.2*  CL 111 102  CO2 21* 22  GLUCOSE 93 84  BUN 5* 5*  CREATININE 0.51 0.42*  CALCIUM 8.6* 8.6*   PT/INR No results for input(s): LABPROT, INR in the last 72 hours.  Recent Labs  Lab 02/14/19 1705 02/15/19 0308 02/17/19 0358  AST 22 19 16   ALT 23 20 16   ALKPHOS 132* 121 97  BILITOT 0.4 0.5 0.9  PROT 8.3* 7.4 6.3*  ALBUMIN 4.2 3.9 3.3*     Lipase     Component Value Date/Time   LIPASE 26 02/14/2019 1705     Medications: . baclofen  10 mg Oral TID  . bisacodyl  10 mg Rectal Daily  . brexpiprazole  2 mg Oral Daily  . diphenhydrAMINE  25 mg Oral Once  . docusate sodium  100 mg Oral Daily  . DULoxetine  30 mg Oral  Daily  . enoxaparin (LOVENOX) injection  40 mg Subcutaneous Daily  . lamoTRIgine  200 mg Oral Daily  . lithium carbonate  300 mg Oral QHS  . methylphenidate  54 mg Oral QAC breakfast  . nortriptyline  25 mg Oral QHS  . pantoprazole (PROTONIX) IV  40 mg Intravenous Q24H  . potassium chloride  40 mEq Oral Q4H  . pregabalin  200 mg Oral TID  . QUEtiapine  200 mg Oral QHS  . sodium chloride flush  3 mL Intravenous Once  . 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 02/16/2019 colorectal surgery for chronic constipation/stimulation issues. Anxiety/depression Hx DVT Hx nephrolithiasis/hydronephrosis Neurogenic bladder with chronic suprapubic catheter ? UTI    Partial small bowel obstruction  -Dilated small bowel loops in the left abdomen and pelvis/transition LLQ  -Patient refused NG/oral Gastrografin for small bowel protocol -no contrast in colon after 8 hours  -NG placed and confirmed in the distal stomach last p.m.  FEN: N.p.o./ice chips ID: None DVT: Lovenox Follow-up: TBD  Plan: DC NG tube, start her on clear liquids.  You can advance her diet as tolerated.  No surgical issues at this time.  Please call if we can be of further assistance.  LOS: 3 days    Ashley Gentry 02/17/2019 Please see Amion

## 2019-02-18 ENCOUNTER — Inpatient Hospital Stay (HOSPITAL_COMMUNITY): Payer: BC Managed Care – PPO

## 2019-02-18 DIAGNOSIS — I959 Hypotension, unspecified: Secondary | ICD-10-CM

## 2019-02-18 DIAGNOSIS — R8271 Bacteriuria: Secondary | ICD-10-CM

## 2019-02-18 DIAGNOSIS — K592 Neurogenic bowel, not elsewhere classified: Secondary | ICD-10-CM

## 2019-02-18 DIAGNOSIS — G950 Syringomyelia and syringobulbia: Secondary | ICD-10-CM

## 2019-02-18 LAB — CBC WITH DIFFERENTIAL/PLATELET
Abs Immature Granulocytes: 0.15 10*3/uL — ABNORMAL HIGH (ref 0.00–0.07)
Basophils Absolute: 0 10*3/uL (ref 0.0–0.1)
Basophils Relative: 0 %
Eosinophils Absolute: 0 10*3/uL (ref 0.0–0.5)
Eosinophils Relative: 0 %
HCT: 42 % (ref 36.0–46.0)
Hemoglobin: 13.1 g/dL (ref 12.0–15.0)
Immature Granulocytes: 1 %
Lymphocytes Relative: 9 %
Lymphs Abs: 1.3 10*3/uL (ref 0.7–4.0)
MCH: 28.7 pg (ref 26.0–34.0)
MCHC: 31.2 g/dL (ref 30.0–36.0)
MCV: 92.1 fL (ref 80.0–100.0)
Monocytes Absolute: 0.5 10*3/uL (ref 0.1–1.0)
Monocytes Relative: 4 %
Neutro Abs: 12.3 10*3/uL — ABNORMAL HIGH (ref 1.7–7.7)
Neutrophils Relative %: 86 %
Platelets: 373 10*3/uL (ref 150–400)
RBC: 4.56 MIL/uL (ref 3.87–5.11)
RDW: 14.3 % (ref 11.5–15.5)
WBC: 14.4 10*3/uL — ABNORMAL HIGH (ref 4.0–10.5)
nRBC: 0 % (ref 0.0–0.2)

## 2019-02-18 LAB — BASIC METABOLIC PANEL
Anion gap: 9 (ref 5–15)
BUN: 6 mg/dL (ref 6–20)
CO2: 21 mmol/L — ABNORMAL LOW (ref 22–32)
Calcium: 8.7 mg/dL — ABNORMAL LOW (ref 8.9–10.3)
Chloride: 103 mmol/L (ref 98–111)
Creatinine, Ser: 0.42 mg/dL — ABNORMAL LOW (ref 0.44–1.00)
GFR calc Af Amer: >60 mL/min (ref >60)
GFR calc non Af Amer: >60 mL/min (ref >60)
Glucose, Bld: 173 mg/dL — ABNORMAL HIGH (ref 70–99)
Potassium: 4.8 mmol/L (ref 3.5–5.1)
Sodium: 133 mmol/L — ABNORMAL LOW (ref 135–145)

## 2019-02-18 LAB — MAGNESIUM: Magnesium: 1.7 mg/dL (ref 1.7–2.4)

## 2019-02-18 MED ORDER — SENNOSIDES-DOCUSATE SODIUM 8.6-50 MG PO TABS: 1.0000 | ORAL_TABLET | Freq: Two times a day (BID) | ORAL | Status: DC | PRN

## 2019-02-18 MED ORDER — LUBIPROSTONE 24 MCG PO CAPS
24.0000 ug | ORAL_CAPSULE | Freq: Two times a day (BID) | ORAL | Status: DC
  Administered 2019-02-18: 16:00:00 24 ug via ORAL
  Filled 2019-02-18 (×2): qty 1

## 2019-02-18 MED ORDER — METOCLOPRAMIDE HCL 5 MG PO TABS
5.0000 mg | ORAL_TABLET | Freq: Three times a day (TID) | ORAL | Status: DC
  Administered 2019-02-18: 5 mg via ORAL
  Filled 2019-02-18 (×2): qty 1

## 2019-02-18 MED ORDER — SIMETHICONE 80 MG PO CHEW
160.0000 mg | CHEWABLE_TABLET | Freq: Four times a day (QID) | ORAL | Status: DC | PRN
  Administered 2019-02-18: 160 mg via ORAL
  Filled 2019-02-18: qty 2

## 2019-02-18 MED ORDER — POLYETHYLENE GLYCOL 3350 17 G PO PACK
17.0000 g | PACK | Freq: Two times a day (BID) | ORAL | Status: DC | PRN
  Administered 2019-02-23 – 2019-02-27 (×5): 17 g via ORAL
  Filled 2019-02-18 (×5): qty 1

## 2019-02-18 MED ORDER — SENNA 8.6 MG PO TABS
1.0000 | ORAL_TABLET | Freq: Every day | ORAL | Status: DC
  Administered 2019-02-18: 10:00:00 8.6 mg via ORAL
  Filled 2019-02-18: qty 1

## 2019-02-18 NOTE — Progress Notes (Signed)
MD paged regarding patients request of repeat lab work and UA. Patient also requesting for solid food. Orders for CMP and UA received.
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
## 2019-02-18 NOTE — Progress Notes (Signed)
MD paged regarding yellow MEWS due to BP of 77/51. Patient is arousable with sternal rub. During callback from MD, BP was obtained and was 81/51. No new orders at this time. Will continue to monitor.
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
## 2019-02-19 ENCOUNTER — Inpatient Hospital Stay (HOSPITAL_COMMUNITY): Payer: BC Managed Care – PPO

## 2019-02-19 DIAGNOSIS — K56609 Unspecified intestinal obstruction, unspecified as to partial versus complete obstruction: Secondary | ICD-10-CM

## 2019-02-19 DIAGNOSIS — K9289 Other specified diseases of the digestive system: Secondary | ICD-10-CM

## 2019-02-19 DIAGNOSIS — F3181 Bipolar II disorder: Secondary | ICD-10-CM

## 2019-02-19 LAB — BASIC METABOLIC PANEL
Anion gap: 13 (ref 5–15)
BUN: 9 mg/dL (ref 6–20)
CO2: 21 mmol/L — ABNORMAL LOW (ref 22–32)
Calcium: 9 mg/dL (ref 8.9–10.3)
Chloride: 95 mmol/L — ABNORMAL LOW (ref 98–111)
Creatinine, Ser: 0.41 mg/dL — ABNORMAL LOW (ref 0.44–1.00)
GFR calc Af Amer: >60 mL/min (ref >60)
GFR calc non Af Amer: >60 mL/min (ref >60)
Glucose, Bld: 120 mg/dL — ABNORMAL HIGH (ref 70–99)
Potassium: 3.8 mmol/L (ref 3.5–5.1)
Sodium: 129 mmol/L — ABNORMAL LOW (ref 135–145)

## 2019-02-19 LAB — SODIUM, URINE, RANDOM: Sodium, Ur: 37 mmol/L

## 2019-02-19 LAB — CBC
HCT: 45.7 % (ref 36.0–46.0)
Hemoglobin: 14.4 g/dL (ref 12.0–15.0)
MCH: 29.3 pg (ref 26.0–34.0)
MCHC: 31.5 g/dL (ref 30.0–36.0)
MCV: 92.9 fL (ref 80.0–100.0)
Platelets: 347 10*3/uL (ref 150–400)
RBC: 4.92 MIL/uL (ref 3.87–5.11)
RDW: 14.6 % (ref 11.5–15.5)
WBC: 16.5 10*3/uL — ABNORMAL HIGH (ref 4.0–10.5)
nRBC: 0 % (ref 0.0–0.2)

## 2019-02-19 LAB — TSH: TSH: 2.086 u[IU]/mL (ref 0.350–4.500)

## 2019-02-19 LAB — CORTISOL-AM, BLOOD: Cortisol - AM: 35.4 ug/dL — ABNORMAL HIGH (ref 6.7–22.6)

## 2019-02-19 LAB — OSMOLALITY, URINE: Osmolality, Ur: 996 mOsm/kg — ABNORMAL HIGH (ref 300–900)

## 2019-02-19 LAB — T4, FREE: Free T4: 0.87 ng/dL (ref 0.61–1.12)

## 2019-02-19 LAB — MAGNESIUM: Magnesium: 1.8 mg/dL (ref 1.7–2.4)

## 2019-02-19 MED ORDER — HYDROMORPHONE HCL 1 MG/ML IJ SOLN
1.0000 mg | INTRAMUSCULAR | Status: DC | PRN
  Administered 2019-02-19 – 2019-02-27 (×19): 1 mg via INTRAVENOUS
  Filled 2019-02-19 (×21): qty 1

## 2019-02-19 MED ORDER — METHYLPHENIDATE HCL ER 18 MG PO TB24
36.0000 mg | ORAL_TABLET | Freq: Every day | ORAL | Status: DC
  Administered 2019-02-21 – 2019-02-25 (×4): 36 mg via ORAL
  Filled 2019-02-19 (×5): qty 2

## 2019-02-19 MED ORDER — PROMETHAZINE HCL 25 MG/ML IJ SOLN
25.0000 mg | Freq: Four times a day (QID) | INTRAMUSCULAR | Status: DC | PRN
  Administered 2019-02-19 – 2019-03-02 (×12): 25 mg via INTRAVENOUS
  Filled 2019-02-19 (×12): qty 1

## 2019-02-19 MED ORDER — POTASSIUM CHLORIDE IN NACL 20-0.9 MEQ/L-% IV SOLN
INTRAVENOUS | Status: AC
  Administered 2019-02-19 – 2019-02-23 (×7): via INTRAVENOUS
  Filled 2019-02-19 (×10): qty 1000
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
## 2019-02-19 NOTE — Progress Notes (Signed)
Patient refusing all PO medication due to nausea. Refused Zofran but Phenergan given after episode of vomiting large amount of brown liquid. Patient requesting a GI consult. Dr Cyndia Skeeters paged to make him aware. Donne Hazel, RN

## 2019-02-19 NOTE — Progress Notes (Signed)
CC: Recurrent bowel dilatation  Subjective: Patient says she has a neurogenic bowel.  She does not want an NG tube and think she needs some kind of camera to look down into her GI tract.  She has seen Dr. Leone Payor in the past for this issue.   Objective: Vital signs in last 24 hours: Temp:  [98.1 F (36.7 C)-98.7 F (37.1 C)] 98.3 F (36.8 C) (11/19 0655) Pulse Rate:  [88-101] 88 (11/19 0655) Resp:  [16-18] 16 (11/19 0655) BP: (106-129)/(78-94) 128/78 (11/19 0655) SpO2:  [96 %-100 %] 98 % (11/19 0655) Weight:  [66.7 kg] 66.7 kg (11/19 0655) Last BM Date: 02/17/19 410 p.o. 600 IV 925 urine No stool recorded She is afebrile vital signs are stable Labs show hyponatremia with a sodium of 129, chloride 95, Glucose of 120 Creatinine is 0.41 WBC 16.5 H/H stable. Repeat films this a.m. shows persistent dilatation small bowel loops with development of gaseous distention of the stomach. Intake/Output from previous day: 11/18 0701 - 11/19 0700 In: 1007.5 [P.O.:410; I.V.:587.5] Out: 925 [Urine:925] Intake/Output this shift: No intake/output data recorded.  General appearance: alert, cooperative and Very anxious and distressed. Resp: clear to auscultation bilaterally GI: Distended, bowel sounds are hypoactive.  She soft, and uncomfortable with palpation.  Lab Results:  Recent Labs    02/18/19 0230 02/19/19 0352  WBC 14.4* 16.5*  HGB 13.1 14.4  HCT 42.0 45.7  PLT 373 347    BMET Recent Labs    02/18/19 0230 02/19/19 0352  NA 133* 129*  K 4.8 3.8  CL 103 95*  CO2 21* 21*  GLUCOSE 173* 120*  BUN 6 9  CREATININE 0.42* 0.41*  CALCIUM 8.7* 9.0   PT/INR No results for input(s): LABPROT, INR in the last 72 hours.  Recent Labs  Lab 02/14/19 1705 02/15/19 0308 02/17/19 0358  AST 22 19 16   ALT 23 20 16   ALKPHOS 132* 121 97  BILITOT 0.4 0.5 0.9  PROT 8.3* 7.4 6.3*  ALBUMIN 4.2 3.9 3.3*     Lipase     Component Value Date/Time   LIPASE 26 02/14/2019  1705     Medications: . brexpiprazole  2 mg Oral Daily  . diphenhydrAMINE  25 mg Oral Once  . DULoxetine  30 mg Oral Daily  . enoxaparin (LOVENOX) injection  40 mg Subcutaneous Daily  . lamoTRIgine  200 mg Oral Daily  . lithium carbonate  300 mg Oral QHS  . lubiprostone  24 mcg Oral BID WC  . methylphenidate  54 mg Oral QAC breakfast  . metoCLOPramide  5 mg Oral TID AC  . nortriptyline  25 mg Oral QHS  . pantoprazole (PROTONIX) IV  40 mg Intravenous Q24H  . pregabalin  200 mg Oral TID  . QUEtiapine  200 mg Oral QHS  . sodium chloride flush  3 mL Intravenous Once   . 0.9 % NaCl with KCl 20 mEq / L      Assessment/Plan S/PE robotic in colostomy 12/2018 Dr. 02/16/2019 colorectal surgery for chronic constipation/stimulation issues. Anxiety/depression Hx DVT Hx nephrolithiasis/hydronephrosis Neurogenic bladder with chronic suprapubic catheter ? UTI Hyponatremia/hypochloremia  Partial small bowel obstruction -Dilated small bowel loops in the left abdomen and pelvis/transition LLQ -Patient refused NG/oral Gastrografin for small bowel protocol-no contrast in colon after 8 hours -NG placed and confirmed in the distal stomach last p.m.  -Small bowel protocol shows contrast in the colon and the ostomy bag on 02/16/2019  -Diet advanced/no surgical indication  -  New small bowel dilatation 02/18/2019; film this a.m. shows      ongoing bowel dilatation and stomach looks like it full of air.  -Dilaudid 0.5 mg x 3; Ativan 1 mg; Phenergan 12.5x1; Zanaflex 4 mg x 3; Seroquel; Lyrica, Cymbalta; can all affect bowel function  FEN: IV fluids/n.p.o. ID: None DVT: Lovenox Follow-up: TBD  Plan: I agree with n.p.o.  If she starts vomiting she needs an NG tube that she does not want one at this point.  I would recommend GI consult for motility issues.  She has seen Dr. Celesta A[facility] in the past.  From a surgical standpoint, I would recommend NG placement with low intermittent wall  suction.  Once she is decompressed I would repeat the small bowel protocol.  She just had the small bowel protocol on 02/16/2019 with contrast into the colon and the colostomy bag.      LOS: 5 days    Lexi Conaty 02/19/2019 Please see Amion
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
## 2019-02-19 NOTE — Progress Notes (Signed)
Call from [facility] in [place] stating their policy is not to insert NGs until two RNs on floor have tried. Read her Amy Easterwood's note. She spoke with radiologist in her department and he verified the policy. Attempted to contact [place] but five calls to [place] went unanswered. Spoke with patient and she agreed to allow another RN Jeannine Boga) and I insert NG after receiving the medication ordered by Edward Jolly. NG was inserted with ease and returned 450 mls of brown, foul smelling liquid. Donne Hazel, RN
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
## 2019-02-20 ENCOUNTER — Inpatient Hospital Stay (HOSPITAL_COMMUNITY): Payer: BC Managed Care – PPO

## 2019-02-20 LAB — CBC
HCT: 40.4 % (ref 36.0–46.0)
Hemoglobin: 12.8 g/dL (ref 12.0–15.0)
MCH: 29.6 pg (ref 26.0–34.0)
MCHC: 31.7 g/dL (ref 30.0–36.0)
MCV: 93.5 fL (ref 80.0–100.0)
Platelets: 352 10*3/uL (ref 150–400)
RBC: 4.32 MIL/uL (ref 3.87–5.11)
RDW: 14.7 % (ref 11.5–15.5)
WBC: 12.8 10*3/uL — ABNORMAL HIGH (ref 4.0–10.5)
nRBC: 0 % (ref 0.0–0.2)

## 2019-02-20 LAB — BASIC METABOLIC PANEL
Anion gap: 13 (ref 5–15)
BUN: 10 mg/dL (ref 6–20)
CO2: 19 mmol/L — ABNORMAL LOW (ref 22–32)
Calcium: 9 mg/dL (ref 8.9–10.3)
Chloride: 105 mmol/L (ref 98–111)
Creatinine, Ser: 0.46 mg/dL (ref 0.44–1.00)
GFR calc Af Amer: >60 mL/min (ref >60)
GFR calc non Af Amer: >60 mL/min (ref >60)
Glucose, Bld: 79 mg/dL (ref 70–99)
Potassium: 3.5 mmol/L (ref 3.5–5.1)
Sodium: 137 mmol/L (ref 135–145)

## 2019-02-20 LAB — MAGNESIUM: Magnesium: 1.8 mg/dL (ref 1.7–2.4)

## 2019-02-20 MED ORDER — SODIUM CHLORIDE (PF) 0.9 % IJ SOLN
INTRAMUSCULAR | Status: AC
  Filled 2019-02-20: qty 50

## 2019-02-20 MED ORDER — IOHEXOL 9 MG/ML PO SOLN
ORAL | Status: AC
  Filled 2019-02-20: qty 1000

## 2019-02-20 MED ORDER — IOHEXOL 9 MG/ML PO SOLN
500.0000 mL | ORAL | Status: AC
  Administered 2019-02-20 (×2): 500 mL via ORAL

## 2019-02-20 MED ORDER — ONDANSETRON HCL 4 MG/2ML IJ SOLN
4.0000 mg | Freq: Four times a day (QID) | INTRAMUSCULAR | Status: DC | PRN
  Administered 2019-02-20 – 2019-03-01 (×7): 4 mg via INTRAVENOUS
  Filled 2019-02-20 (×6): qty 2

## 2019-02-20 MED ORDER — IOHEXOL 300 MG/ML  SOLN
100.0000 mL | Freq: Once | INTRAMUSCULAR | Status: AC | PRN
  Administered 2019-02-20: 100 mL via INTRAVENOUS

## 2019-02-20 NOTE — Progress Notes (Signed)
CT scan reviewed with Dr. Hassell Done and patient reexamined. Abdomen benign, some stool in ostomy pouch. Will continue nonoperative management for now. Repeat abdominal film in the AM. If SBO not resolving she may need surgical intervention.  Wellington Hampshire, [facility] 02/20/2019, 3:43 PM Please see Amion for pager number during day hours 7:00am-4:30pm

## 2019-02-20 NOTE — Progress Notes (Signed)
CC:  ? SBO  Subjective: NG reinserted last PM.; CT ordered for this AM.  She has about a 1/2 cannister full of brown feculent appearing fluid.  It is clamped and they are doing the contrast for her CT.  She is softer and in less distress.  She also has some soft stool in her ostomy bag this AM.    Objective: Vital signs in last 24 hours: Temp:  [97.8 F (36.6 C)-98.7 F (37.1 C)] 98.6 F (37 C) (11/20 0626) Pulse Rate:  [91-105] 105 (11/20 0626) Resp:  [17-18] 18 (11/20 0626) BP: (127-143)/(78-99) 131/78 (11/20 0626) SpO2:  [96 %-99 %] 96 % (11/20 0626) Last BM Date: 02/17/19 1300 from the NG recorded NO BM recorded 1554 IV recorded Afebrile, HR 100's but stable K+ 3.5  Intake/Output from previous day: 11/19 0701 - 11/20 0700 In: 1554.4 [I.V.:1554.4] Out: 2400 [Urine:1100; Emesis/NG output:1300] Intake/Output this shift: Total I/O In: 951 [I.V.:951] Out: 1450 [Urine:600; Emesis/NG output:850]  General appearance: alert, cooperative and no distress GI: soft, not as uncomfortable this AM as yesterday, NG in, some soft stool in her ostomy this AM  Lab Results:  Recent Labs    02/19/19 0352 02/20/19 0304  WBC 16.5* 12.8*  HGB 14.4 12.8  HCT 45.7 40.4  PLT 347 352    BMET Recent Labs    02/19/19 0352 02/20/19 0304  NA 129* 137  K 3.8 3.5  CL 95* 105  CO2 21* 19*  GLUCOSE 120* 79  BUN 9 10  CREATININE 0.41* 0.46  CALCIUM 9.0 9.0   PT/INR No results for input(s): LABPROT, INR in the last 72 hours.  Recent Labs  Lab 02/14/19 1705 02/15/19 0308 02/17/19 0358  AST 22 19 16   ALT 23 20 16   ALKPHOS 132* 121 97  BILITOT 0.4 0.5 0.9  PROT 8.3* 7.4 6.3*  ALBUMIN 4.2 3.9 3.3*     Lipase     Component Value Date/Time   LIPASE 26 02/14/2019 1705     . 0.9 % NaCl with KCl 20 mEq / L 75 mL/hr at 02/19/19 2254   Medications: . diphenhydrAMINE  25 mg Oral Once  . enoxaparin (LOVENOX) injection  40 mg Subcutaneous Daily  . lamoTRIgine  200 mg Oral  Daily  . lithium carbonate  300 mg Oral QHS  . methylphenidate  36 mg Oral QAC breakfast  . pantoprazole (PROTONIX) IV  40 mg Intravenous Q24H  . pregabalin  200 mg Oral TID  . QUEtiapine  200 mg Oral QHS  . sodium chloride flush  3 mL Intravenous Once    Assessment/Plan S/PE robotic in colostomy 12/2018 Dr. Lyndel Pleasure colorectal surgery for chronic constipation/stimulation issues. Anxiety/depression Hx DVT Hx nephrolithiasis/hydronephrosis Neurogenic bladder with chronic suprapubic catheter ? UTI Hyponatremia/hypochloremia  Partial small bowel obstruction -Dilated small bowel loops in the left abdomen and pelvis/transition LLQ -Patient refused NG/oral Gastrografin for small bowel protocol-no contrast in colon after 8 hours -NG placed and confirmed in the distal stomach last p.m.  -Small bowel protocol shows contrast in the colon and the ostomy bag on 02/16/2019  -Diet advanced/no surgical indication  - New small bowel dilatation 02/18/2019; film this a.m. shows      ongoing bowel dilatation and stomach looks like it full of air.  -Dilaudid 0.5 mg x 3; Ativan 1 mg; Phenergan 12.5x1; Zanaflex 4 mg x 3; Seroquel; Lyrica, Cymbalta; can all affect bowel function  FEN: IV fluids/n.p.o. ID: None DVT: Lovenox Follow-up: TBD  Plan:  CT pending, agree with NG decompression.  Appreciate GI/Psych assist      LOS: 6 days    Ashley Gentry 02/20/2019 Please see Amion

## 2019-02-20 NOTE — Progress Notes (Addendum)
Patient ID: Ashley Gentry, female   DOB: 06/07/1975, 43 y.o.   MRN: [id number]   Brief GI note-  Large-volume NG output after NG replaced last evening  CT of the abdomen and pelvis-just done shows proximal small bowel loops dilated up to 4.3 cm with abrupt transition zone in the left abdomen at at the proximal to mid small bowel level, there is then a short segment of clustered small bowel distal to the transition zone showing intraluminal fluid with circumferential wall thickening and adjacent confluent interloop mesenteric fluid is clustered abnormal small bowel with wall thickening tracks into a second transition zone and small bowel loops distal to this are completely decompressed Imaging features raise concern for closed-loop obstruction or internal hernia, wall thickening could reflect ischemia though no pneumatosis evident  Also noted bilateral renal stones with a new 2 x 2 x 4 mm right UPJ stone which dropped down since 02/14/2019.   Patient will need exploratory lap, will contact surgery.   I have discussed the case with the PA, and that is the plan I formulated.  This is a mechanical SBO - will leave surgical decision to that consulting team.  Signing off    Nelida Meuse III Office: [phone]
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
## 2019-02-20 NOTE — Progress Notes (Signed)
Patient does not believe her NG is working despite NT changing out container and recording drainage. This RN flushed NG with water and both ports with air. Suction is clearly working, as there is drainage in the tubing and they container. Patient continues to repeat same thing over. Educated patient multiple[le times on NG setup. She will not accept explanation nor demonstration. MD paged to ask for medication for agitation. Donne Hazel, RN

## 2019-02-20 NOTE — Progress Notes (Signed)
Call to [facility]; spoke with Tedra Coupe to let her know second bottle of contrast has been given per NG tube. Donne Hazel, RN

## 2019-02-21 ENCOUNTER — Inpatient Hospital Stay (HOSPITAL_COMMUNITY): Payer: BC Managed Care – PPO

## 2019-02-21 LAB — CBC
HCT: 38.9 % (ref 36.0–46.0)
Hemoglobin: 12 g/dL (ref 12.0–15.0)
MCH: 29.4 pg (ref 26.0–34.0)
MCHC: 30.8 g/dL (ref 30.0–36.0)
MCV: 95.3 fL (ref 80.0–100.0)
Platelets: 325 10*3/uL (ref 150–400)
RBC: 4.08 MIL/uL (ref 3.87–5.11)
RDW: 14.8 % (ref 11.5–15.5)
WBC: 10.7 10*3/uL — ABNORMAL HIGH (ref 4.0–10.5)
nRBC: 0 % (ref 0.0–0.2)

## 2019-02-21 LAB — BASIC METABOLIC PANEL
Anion gap: 14 (ref 5–15)
BUN: 6 mg/dL (ref 6–20)
CO2: 17 mmol/L — ABNORMAL LOW (ref 22–32)
Calcium: 8.9 mg/dL (ref 8.9–10.3)
Chloride: 107 mmol/L (ref 98–111)
Creatinine, Ser: 0.46 mg/dL (ref 0.44–1.00)
GFR calc Af Amer: >60 mL/min (ref >60)
GFR calc non Af Amer: >60 mL/min (ref >60)
Glucose, Bld: 56 mg/dL — ABNORMAL LOW (ref 70–99)
Potassium: 3.9 mmol/L (ref 3.5–5.1)
Sodium: 138 mmol/L (ref 135–145)

## 2019-02-21 LAB — PHOSPHORUS: Phosphorus: 3.1 mg/dL (ref 2.5–4.6)

## 2019-02-21 LAB — MAGNESIUM: Magnesium: 1.8 mg/dL (ref 1.7–2.4)

## 2019-02-21 MED ORDER — LIP MEDEX EX OINT
TOPICAL_OINTMENT | CUTANEOUS | Status: AC
  Administered 2019-02-21: 10:00:00
  Filled 2019-02-21: qty 7

## 2019-02-21 MED ORDER — MENTHOL 3 MG MT LOZG
1.0000 | LOZENGE | OROMUCOSAL | Status: DC | PRN
  Administered 2019-02-21: 3 mg via ORAL
  Filled 2019-02-21 (×3): qty 9
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
## 2019-02-21 NOTE — Progress Notes (Signed)
[facility]:  [phone] General Surgery Progress Note   LOS: 7 days  POD -     Chief Complaint: Abbominal pain  Assessment and Plan: 1.  SBO - originally improved, now worse again  No improvement by KUB today  CT scan - 02/20/2019 - raises question of closed loop obstruction  WBC - 10,700 - 02/21/2019  Repeat KUB in AM - if not progressing, will need surgery.  Discussed with patient.  ?PICC line for IV access (she said that she had a prior DVT with a PICC) 2.  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 Quentin Cornwall, [place], for chronic constipation/stimulation issues. 4.  Anxiety/depression 5.  Hx DVT (in prior PICC) 6.  Hx nephrolithiasis 7.  Neurogenic bladder with chronic suprapubic catheter 8.  History of bipolar disease 9.  DVT prophylaxis - Lovenox   Principal Problem:   SBO (small bowel obstruction) (HCC) Active Problems:   Gastroesophageal reflux disease   Paraplegia (HCC)   Bipolar II disorder (HCC)   ADHD, predominantly inattentive type   Hypokalemia  Subjective:  In no pain, but NGT bothers her.  She lives in [place].  But she presented to [place].  Her parents live in [place].  Objective:   Vitals:   02/20/19 2044 02/21/19 0552  BP: 134/90 114/66  Pulse: 86 90  Resp: 20 18  Temp: 98.1 F (36.7 C) (!) 97.5 F (36.4 C)  SpO2: 98% 95%     Intake/Output from previous day:  11/20 0701 - 11/21 0700 In: 1934.7 [I.V.:1934.7] Out: 4025 [Urine:2100; Emesis/NG GMWNUU:7253]  Intake/Output this shift:  No intake/output data recorded.   Physical Exam:   General: WN F who is alert and oriented.    HEENT: Normal. Pupils equal.  Has NGT. Marland Kitchen   Lungs: Clear   Abdomen: Moderate distention.  Ostomy in LUQ.   Lab Results:    Recent Labs    02/20/19 0304 02/21/19 0325  WBC 12.8* 10.7*  HGB 12.8 12.0  HCT 40.4 38.9  PLT 352 325    BMET   Recent Labs     02/19/19 0352 02/20/19 0304  NA 129* 137  K 3.8 3.5  CL 95* 105  CO2 21* 19*  GLUCOSE 120* 79  BUN 9 10  CREATININE 0.41* 0.46  CALCIUM 9.0 9.0    PT/INR  No results for input(s): LABPROT, INR in the last 72 hours.  ABG  No results for input(s): PHART, HCO3 in the last 72 hours.  Invalid input(s): PCO2, PO2   Studies/Results:  Dg Abd 1 View  Result Date: 02/19/2019 CLINICAL DATA:  NG tube placement EXAM: ABDOMEN - 1 VIEW COMPARISON:  02/19/2019, 02/18/2019, 02/17/2019 FINDINGS: Lung bases are clear. Esophageal tube tip overlies the second portion of the duodenum. Persistent small bowel distension suspect for obstruction. Contrast material in the colon. Right kidney stones. Posterior spinal rods and fixating screws in the thoracolumbar spine. IMPRESSION: 1. Esophageal tube tip overlies the proximal duodenum. 2. Persistent dilatation of small bowel loops concerning for bowel obstruction Electronically Signed   By: Donavan Foil M.D.   On: 02/19/2019 18:28   Ct Abdomen Pelvis W Contrast  Result Date: 02/20/2019 CLINICAL DATA:  Bowel obstruction. EXAM: CT ABDOMEN AND PELVIS WITH CONTRAST TECHNIQUE: Multidetector CT imaging of the abdomen and pelvis was performed using the standard protocol following bolus administration of intravenous contrast. CONTRAST:  15mL OMNIPAQUE IOHEXOL 300 MG/ML  SOLN COMPARISON:  CT stone study 02/14/2019. FINDINGS: Lower chest: Bibasilar collapse/consolidation, left greater than right, is new in the interval. Hepatobiliary: Small area of low attenuation in the anterior liver, adjacent to the falciform ligament, is in a characteristic location for focal fatty deposition. The liver shows diffusely decreased attenuation suggesting fat deposition. 1.7 x 1.3 cm wedge-shaped subcapsular focus of subtle hyperenhancement is identified in the inferior right liver (27/2) likely transient hepatic attenuation difference from vascular anomaly. No discernible underlying mass  lesion as etiology. There is no evidence for gallstones, gallbladder wall thickening, or pericholecystic fluid. No intrahepatic or extrahepatic biliary dilation. Pancreas: No focal mass lesion. No dilatation of the main duct. No intraparenchymal cyst. No peripancreatic edema. Spleen: No splenomegaly. No focal mass lesion. Adrenals/Urinary Tract: No adrenal nodule or mass. Bilateral nonobstructing renal stones again identified measuring up to 7 mm in the left kidney. No suspicious enhancing renal mass lesion. There is a 2 x 2 x 4 mm stone noted in the right UPJ on today's exam which was not present previously and is visible on image 41 of series 2, also well seen on coronal image 54 of series 5. No evidence for hydroureter. Bladder is decompressed by a suprapubic tube. Stomach/Bowel: Stomach is not markedly distended. The tip of the NG tube is transpyloric, in the descending duodenum, as noted on x-ray from yesterday. Duodenum is not dilated. Jejunum becomes fluid-filled and distended with loops measuring up to about 4.3 cm maximum diameter. In the proximal to mid small bowel, the dilated jejunal loops abruptly transition to completely decompressed (well seen on axial 55/2 and coronal 46/5. This then tracks into a short segment of clustered small bowel showing circumferential wall thickening and confluent interloop mesenteric fluid (image 67/2). This abnormal small bowel than tracks distally into a second transition zone visible on axial 53/2 and coronal 37/5. Mid and distal small bowel beyond this second transition zone is completely decompressed into the terminal ileum. 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 There is contrast material in the colon which is decompressed throughout. No substantial contrast is seen in small bowel loops on the current study. Vascular/Lymphatic: No abdominal aortic aneurysm. No abdominal aortic atherosclerotic  calcification. There is no gastrohepatic or hepatoduodenal ligament lymphadenopathy. No intraperitoneal or retroperitoneal lymphadenopathy. No pelvic sidewall lymphadenopathy. Reproductive: Uterus surgically absent.  There is no adnexal mass. Other: Moderate volume free fluid noted in the pelvis. Musculoskeletal: Gas bubbles in the subcutaneous fat of the left abdominal wall presumably from injection. No worrisome lytic or sclerotic osseous abnormality. Extensive thoracolumbar fusion hardware evident. IMPRESSION: 1. Proximal small bowel loops are dilated up to 4.3 cm diameter. There is an abrupt transition zone in the left abdomen at about the proximal to mid small bowel level. A short segment of clustered small bowel distal to this transition zone shows intraluminal fluid with circumferential wall thickening and adjacent confluent interloop mesenteric fluid. The clustered abnormal small bowel with wall thickening tracks into a second transition zone and small bowel loops distal to this second transition are completely decompressed to the level of the terminal ileum. Given that the small bowel between the 2 transition zones shows abnormal circumferential wall thickening and adjacent mesenteric fluid, imaging features raise concern for closed loop obstruction or internal hernia. Wall thickening in this small-bowel between the transition zones could reflect ischemia although no pneumatosis is evident at this time. 2. Contrast material is visible in the colon. Contrast  material was visible in the colon on abdomen film of 02/16/2019 performed as part of a small bowel obstruction protocol. Small bowel distension on x-ray from 02/19/2019 is clearly progressive when comparing to that earlier film of 02/16/2019. 3. NG tube tip is in the descending duodenum. Proximal side port of the NG tube is probably transpyloric. 4. Bilateral renal stones with a new 2 x 2 x 4 mm right UPJ stone since 02/14/2019. This stone causes no  substantial right hydronephrosis at this time. 5. Interval development of bibasilar collapse/consolidation in the lower lungs. Electronically Signed   By: Kennith CenterEric  Mansell M.D.   On: 02/20/2019 13:00   Dg Abd Portable 1v  Result Date: 02/21/2019 CLINICAL DATA:  Small bowel obstruction. EXAM: PORTABLE ABDOMEN - 1 VIEW COMPARISON:  CT yesterday. FINDINGS: Tip and side port of the enteric tube below the diaphragm in the stomach. Dilated small bowel in the central abdomen again seen, not significantly changed from CT yesterday. Contrast in the colon again seen, also present on CT. No evidence of free air. Lung bases are clear. IMPRESSION: Persistent small bowel obstruction with unchanged small bowel dilatation. Electronically Signed   By: Narda RutherfordMelanie  Sanford M.D.   On: 02/21/2019 05:48   Kub  Result Date: 02/19/2019 CLINICAL DATA:  43 year old female follow-up small-bowel obstruction. EXAM: PORTABLE ABDOMEN - 1 VIEW COMPARISON:  Abdominal radiograph dated 02/18/2019. FINDINGS: Persistent dilatation of air-filled loops of small bowel in the mid to upper abdomen measuring up to 4 cm in caliber. There has been interval development of gaseous distention of the stomach. No definite free air identified. Several radiopaque foci over the expected region of the renal silhouettes correspond to the stones seen on the prior CT. No acute osseous pathology. Spinal fixation hardware noted. The soft tissues are unremarkable. IMPRESSION: 1. Persistent dilatation of small bowel loops with interval development of gaseous distention of the stomach. Findings indicative of worsening obstruction. 2. Bilateral renal calculi. Electronically Signed   By: Elgie CollardArash  Radparvar M.D.   On: 02/19/2019 09:08     Anti-infectives:   Anti-infectives (From admission, onward)   Start     Dose/Rate Route Frequency Ordered Stop   02/16/19 1100  ciprofloxacin (CIPRO) IVPB 400 mg  Status:  Discontinued     400 mg 200 mL/hr over 60 Minutes Intravenous  Every 12 hours 02/16/19 0919 02/16/19 1112   02/15/19 1000  erythromycin (E-MYCIN) tablet 250 mg  Status:  Discontinued    Note to Pharmacy: 1-2 daily     250 mg Oral Daily 02/15/19 0319 02/15/19 1528   02/15/19 0800  ciprofloxacin (CIPRO) tablet 500 mg  Status:  Discontinued     500 mg Oral 2 times daily 02/15/19 0319 02/16/19 0919      Ovidio Kinavid Cosette Prindle, MD, [facility]: [phone] 02/21/2019

## 2019-02-22 ENCOUNTER — Inpatient Hospital Stay (HOSPITAL_COMMUNITY): Payer: BC Managed Care – PPO

## 2019-02-22 ENCOUNTER — Inpatient Hospital Stay (HOSPITAL_COMMUNITY): Payer: BC Managed Care – PPO | Admitting: Certified Registered Nurse Anesthetist

## 2019-02-22 ENCOUNTER — Encounter (HOSPITAL_COMMUNITY)
Admission: EM | Disposition: A | Discharge status: Home Health | Payer: Self-pay | Source: Home / Self Care | Attending: Family Medicine

## 2019-02-22 HISTORY — PX: LAPAROSCOPY: SHX197

## 2019-02-22 HISTORY — PX: LAPAROTOMY: SHX154

## 2019-02-22 LAB — PHOSPHORUS: Phosphorus: 3 mg/dL (ref 2.5–4.6)

## 2019-02-22 LAB — SURGICAL PCR SCREEN
MRSA, PCR: NEGATIVE
Staphylococcus aureus: NEGATIVE

## 2019-02-22 LAB — CBC
HCT: 38.5 % (ref 36.0–46.0)
Hemoglobin: 11.8 g/dL — ABNORMAL LOW (ref 12.0–15.0)
MCH: 29.3 pg (ref 26.0–34.0)
MCHC: 30.6 g/dL (ref 30.0–36.0)
MCV: 95.5 fL (ref 80.0–100.0)
Platelets: 337 10*3/uL (ref 150–400)
RBC: 4.03 MIL/uL (ref 3.87–5.11)
RDW: 14.5 % (ref 11.5–15.5)
WBC: 9.9 10*3/uL (ref 4.0–10.5)
nRBC: 0 % (ref 0.0–0.2)

## 2019-02-22 LAB — BASIC METABOLIC PANEL
Anion gap: 14 (ref 5–15)
BUN: <5 mg/dL — ABNORMAL LOW (ref 6–20)
CO2: 12 mmol/L — ABNORMAL LOW (ref 22–32)
Calcium: 8.5 mg/dL — ABNORMAL LOW (ref 8.9–10.3)
Chloride: 106 mmol/L (ref 98–111)
Creatinine, Ser: 0.53 mg/dL (ref 0.44–1.00)
GFR calc Af Amer: >60 mL/min (ref >60)
GFR calc non Af Amer: >60 mL/min (ref >60)
Glucose, Bld: 60 mg/dL — ABNORMAL LOW (ref 70–99)
Potassium: 4.3 mmol/L (ref 3.5–5.1)
Sodium: 132 mmol/L — ABNORMAL LOW (ref 135–145)

## 2019-02-22 LAB — MAGNESIUM: Magnesium: 2 mg/dL (ref 1.7–2.4)

## 2019-02-22 LAB — GLUCOSE, CAPILLARY
Glucose-Capillary: 54 mg/dL — ABNORMAL LOW (ref 70–99)
Glucose-Capillary: 107 mg/dL — ABNORMAL HIGH (ref 70–99)

## 2019-02-22 SURGERY — LAPAROSCOPY, DIAGNOSTIC
Anesthesia: General | Site: Abdomen

## 2019-02-22 MED ORDER — SCOPOLAMINE 1 MG/3DAYS TD PT72
MEDICATED_PATCH | TRANSDERMAL | Status: AC
  Filled 2019-02-22: qty 1

## 2019-02-22 MED ORDER — HYDROMORPHONE HCL 1 MG/ML IJ SOLN: 0.2500 mg | INTRAMUSCULAR | Status: DC | PRN

## 2019-02-22 MED ORDER — ONDANSETRON HCL 4 MG/2ML IJ SOLN
INTRAMUSCULAR | Status: AC
  Filled 2019-02-22: qty 2

## 2019-02-22 MED ORDER — FENTANYL CITRATE (PF) 100 MCG/2ML IJ SOLN
INTRAMUSCULAR | Status: DC | PRN
  Administered 2019-02-22 (×4): 50 ug via INTRAVENOUS

## 2019-02-22 MED ORDER — SODIUM CHLORIDE 0.9 % IV SOLN
INTRAVENOUS | Status: AC
  Filled 2019-02-22: qty 2

## 2019-02-22 MED ORDER — ROCURONIUM BROMIDE 50 MG/5ML IV SOSY
PREFILLED_SYRINGE | INTRAVENOUS | Status: DC | PRN
  Administered 2019-02-22: 80 mg via INTRAVENOUS

## 2019-02-22 MED ORDER — OXYCODONE HCL 5 MG PO TABS: 5.0000 mg | ORAL_TABLET | Freq: Once | ORAL | Status: DC | PRN

## 2019-02-22 MED ORDER — PHENYLEPHRINE 40 MCG/ML (10ML) SYRINGE FOR IV PUSH (FOR BLOOD PRESSURE SUPPORT)
PREFILLED_SYRINGE | INTRAVENOUS | Status: AC
  Filled 2019-02-22: qty 10

## 2019-02-22 MED ORDER — MIDAZOLAM HCL 2 MG/2ML IJ SOLN
INTRAMUSCULAR | Status: AC
  Filled 2019-02-22: qty 2

## 2019-02-22 MED ORDER — OXYCODONE HCL 5 MG/5ML PO SOLN: 5.0000 mg | Freq: Once | ORAL | Status: DC | PRN

## 2019-02-22 MED ORDER — LACTATED RINGERS IV SOLN
INTRAVENOUS | Status: DC | PRN
  Administered 2019-02-22 (×2): via INTRAVENOUS

## 2019-02-22 MED ORDER — PROMETHAZINE HCL 25 MG/ML IJ SOLN
INTRAMUSCULAR | Status: DC | PRN
  Administered 2019-02-22: 6.25 mg via INTRAVENOUS

## 2019-02-22 MED ORDER — SODIUM CHLORIDE 0.9 % IR SOLN
Status: DC | PRN
  Administered 2019-02-22: 1000 mL

## 2019-02-22 MED ORDER — PHENYLEPHRINE HCL (PRESSORS) 10 MG/ML IV SOLN
INTRAVENOUS | Status: AC
  Filled 2019-02-22: qty 1

## 2019-02-22 MED ORDER — DEXTROSE IN LACTATED RINGERS 5 % IV SOLN
INTRAVENOUS | Status: DC | PRN
  Administered 2019-02-22: 10:00:00 via INTRAVENOUS

## 2019-02-22 MED ORDER — DEXAMETHASONE SODIUM PHOSPHATE 10 MG/ML IJ SOLN
INTRAMUSCULAR | Status: DC | PRN
  Administered 2019-02-22: 5 mg via INTRAVENOUS

## 2019-02-22 MED ORDER — MIDAZOLAM HCL 2 MG/2ML IJ SOLN
INTRAMUSCULAR | Status: DC | PRN
  Administered 2019-02-22: 2 mg via INTRAVENOUS

## 2019-02-22 MED ORDER — LIDOCAINE 2% (20 MG/ML) 5 ML SYRINGE
INTRAMUSCULAR | Status: DC | PRN
  Administered 2019-02-22: 60 mg via INTRAVENOUS

## 2019-02-22 MED ORDER — MORPHINE SULFATE (PF) 2 MG/ML IV SOLN
1.0000 mg | INTRAVENOUS | Status: DC | PRN
  Administered 2019-02-22 – 2019-03-02 (×7): 2 mg via INTRAVENOUS
  Administered 2019-03-03 (×2): 1 mg via INTRAVENOUS
  Filled 2019-02-22 (×9): qty 1

## 2019-02-22 MED ORDER — PHENYLEPHRINE 40 MCG/ML (10ML) SYRINGE FOR IV PUSH (FOR BLOOD PRESSURE SUPPORT)
PREFILLED_SYRINGE | INTRAVENOUS | Status: DC | PRN
  Administered 2019-02-22: 80 ug via INTRAVENOUS
  Administered 2019-02-22: 40 ug via INTRAVENOUS
  Administered 2019-02-22 (×3): 80 ug via INTRAVENOUS

## 2019-02-22 MED ORDER — PHENYLEPHRINE HCL (PRESSORS) 10 MG/ML IV SOLN: INTRAVENOUS | Status: DC | PRN

## 2019-02-22 MED ORDER — PROPOFOL 10 MG/ML IV BOLUS
INTRAVENOUS | Status: DC | PRN
  Administered 2019-02-22: 200 mg via INTRAVENOUS

## 2019-02-22 MED ORDER — ACETAMINOPHEN 10 MG/ML IV SOLN
INTRAVENOUS | Status: DC | PRN
  Administered 2019-02-22: 1000 mg via INTRAVENOUS

## 2019-02-22 MED ORDER — BUPIVACAINE HCL (PF) 0.25 % IJ SOLN
INTRAMUSCULAR | Status: AC
  Filled 2019-02-22: qty 30

## 2019-02-22 MED ORDER — ONDANSETRON HCL 4 MG/2ML IJ SOLN
INTRAMUSCULAR | Status: DC | PRN
  Administered 2019-02-22: 4 mg via INTRAVENOUS

## 2019-02-22 MED ORDER — PROMETHAZINE HCL 25 MG/ML IJ SOLN: 6.2500 mg | INTRAMUSCULAR | Status: DC | PRN

## 2019-02-22 MED ORDER — LACTATED RINGERS IV SOLN
INTRAVENOUS | Status: AC | PRN
  Administered 2019-02-22: 1000 mL

## 2019-02-22 MED ORDER — SCOPOLAMINE 1 MG/3DAYS TD PT72
MEDICATED_PATCH | TRANSDERMAL | Status: DC | PRN
  Administered 2019-02-22: 1 via TRANSDERMAL

## 2019-02-22 MED ORDER — ACETAMINOPHEN 10 MG/ML IV SOLN
INTRAVENOUS | Status: AC
  Filled 2019-02-22: qty 100

## 2019-02-22 MED ORDER — LIDOCAINE 2% (20 MG/ML) 5 ML SYRINGE
INTRAMUSCULAR | Status: AC
  Filled 2019-02-22: qty 5

## 2019-02-22 MED ORDER — ROCURONIUM BROMIDE 10 MG/ML (PF) SYRINGE
PREFILLED_SYRINGE | INTRAVENOUS | Status: AC
  Filled 2019-02-22: qty 10

## 2019-02-22 MED ORDER — PROPOFOL 10 MG/ML IV BOLUS
INTRAVENOUS | Status: AC
  Filled 2019-02-22: qty 20

## 2019-02-22 MED ORDER — LIDOCAINE HCL (CARDIAC) PF 100 MG/5ML IV SOSY: PREFILLED_SYRINGE | INTRAVENOUS | Status: DC | PRN

## 2019-02-22 MED ORDER — DEXAMETHASONE SODIUM PHOSPHATE 10 MG/ML IJ SOLN
INTRAMUSCULAR | Status: AC
  Filled 2019-02-22: qty 1

## 2019-02-22 MED ORDER — PROMETHAZINE HCL 25 MG/ML IJ SOLN
INTRAMUSCULAR | Status: AC
  Filled 2019-02-22: qty 1

## 2019-02-22 MED ORDER — LACTATED RINGERS IV SOLN: INTRAVENOUS | Status: DC

## 2019-02-22 MED ORDER — MEPERIDINE HCL 50 MG/ML IJ SOLN: 6.2500 mg | INTRAMUSCULAR | Status: DC | PRN

## 2019-02-22 MED ORDER — KETOROLAC TROMETHAMINE 30 MG/ML IJ SOLN: 30.0000 mg | Freq: Once | INTRAMUSCULAR | Status: DC | PRN

## 2019-02-22 MED ORDER — FENTANYL CITRATE (PF) 250 MCG/5ML IJ SOLN
INTRAMUSCULAR | Status: AC
  Filled 2019-02-22: qty 5

## 2019-02-22 MED ORDER — ROCURONIUM BROMIDE 100 MG/10ML IV SOLN: INTRAVENOUS | Status: DC | PRN

## 2019-02-22 MED ORDER — SUGAMMADEX SODIUM 200 MG/2ML IV SOLN
INTRAVENOUS | Status: DC | PRN
  Administered 2019-02-22: 200 mg via INTRAVENOUS

## 2019-02-22 SURGICAL SUPPLY — 65 items
APPLICATOR COTTON TIP 6 STRL (MISCELLANEOUS) IMPLANT
APPLICATOR COTTON TIP 6IN STRL (MISCELLANEOUS)
BENZOIN TINCTURE PRP APPL 2/3 (GAUZE/BANDAGES/DRESSINGS) IMPLANT
BLADE EXTENDED COATED 6.5IN (ELECTRODE) IMPLANT
BLADE HEX COATED 2.75 (ELECTRODE) ×3 IMPLANT
CABLE HIGH FREQUENCY MONO STRZ (ELECTRODE) ×3 IMPLANT
CHLORAPREP W/TINT 26 (MISCELLANEOUS) ×3 IMPLANT
COVER MAYO STAND STRL (DRAPES) ×3 IMPLANT
COVER SURGICAL LIGHT HANDLE (MISCELLANEOUS) ×3 IMPLANT
COVER WAND RF STERILE (DRAPES) IMPLANT
DECANTER SPIKE VIAL GLASS SM (MISCELLANEOUS) IMPLANT
DERMABOND ADVANCED (GAUZE/BANDAGES/DRESSINGS) ×1
DERMABOND ADVANCED .7 DNX12 (GAUZE/BANDAGES/DRESSINGS) ×2 IMPLANT
DRAPE LAPAROSCOPIC ABDOMINAL (DRAPES) ×3 IMPLANT
DRAPE WARM FLUID 44X44 (DRAPES) IMPLANT
ELECT REM PT RETURN 15FT ADLT (MISCELLANEOUS) ×3 IMPLANT
GAUZE SPONGE 4X4 12PLY STRL (GAUZE/BANDAGES/DRESSINGS) IMPLANT
GLOVE BIOGEL PI IND STRL 7.0 (GLOVE) ×2 IMPLANT
GLOVE BIOGEL PI INDICATOR 7.0 (GLOVE) ×1
GLOVE SURG SYN 7.5  E (GLOVE) ×1
GLOVE SURG SYN 7.5 E (GLOVE) ×2 IMPLANT
GOWN SPEC L4 XLG W/TWL (GOWN DISPOSABLE) ×3 IMPLANT
GOWN STRL REUS W/ TWL XL LVL3 (GOWN DISPOSABLE) IMPLANT
GOWN STRL REUS W/TWL LRG LVL3 (GOWN DISPOSABLE) ×3 IMPLANT
GOWN STRL REUS W/TWL XL LVL3 (GOWN DISPOSABLE) ×9 IMPLANT
HANDLE SUCTION POOLE (INSTRUMENTS) IMPLANT
IRRIG SUCT STRYKERFLOW 2 WTIP (MISCELLANEOUS)
IRRIGATION SUCT STRKRFLW 2 WTP (MISCELLANEOUS) IMPLANT
KIT BASIN OR (CUSTOM PROCEDURE TRAY) ×3 IMPLANT
KIT TURNOVER KIT A (KITS) IMPLANT
NS IRRIG 1000ML POUR BTL (IV SOLUTION) ×3 IMPLANT
PACK GENERAL/GYN (CUSTOM PROCEDURE TRAY) ×3 IMPLANT
PENCIL SMOKE EVACUATOR (MISCELLANEOUS) IMPLANT
SCISSORS LAP 5X35 DISP (ENDOMECHANICALS) ×3 IMPLANT
SHEARS HARMONIC ACE PLUS 36CM (ENDOMECHANICALS) IMPLANT
SHEARS HARMONIC ACE PLUS 45CM (MISCELLANEOUS) ×3 IMPLANT
SLEEVE ADV FIXATION 5X100MM (TROCAR) ×9 IMPLANT
SLEEVE XCEL OPT CAN 5 100 (ENDOMECHANICALS) ×3 IMPLANT
SPONGE LAP 18X18 RF (DISPOSABLE) IMPLANT
STAPLER VISISTAT 35W (STAPLE) IMPLANT
STRIP CLOSURE SKIN 1/2X4 (GAUZE/BANDAGES/DRESSINGS) IMPLANT
SUCTION POOLE HANDLE (INSTRUMENTS)
SUT MNCRL AB 4-0 PS2 18 (SUTURE) ×3 IMPLANT
SUT PDS AB 1 CTX 36 (SUTURE) IMPLANT
SUT SILK 2 0 (SUTURE)
SUT SILK 2 0 SH (SUTURE) ×9 IMPLANT
SUT SILK 2 0 SH CR/8 (SUTURE) IMPLANT
SUT SILK 2-0 18XBRD TIE 12 (SUTURE) IMPLANT
SUT SILK 3 0 (SUTURE)
SUT SILK 3 0 SH CR/8 (SUTURE) IMPLANT
SUT SILK 3-0 18XBRD TIE 12 (SUTURE) IMPLANT
SUT VICRYL 0 UR6 27IN ABS (SUTURE) ×3 IMPLANT
SUT VICRYL 2 0 18  UND BR (SUTURE)
SUT VICRYL 2 0 18 UND BR (SUTURE) IMPLANT
TOWEL OR 17X26 10 PK STRL BLUE (TOWEL DISPOSABLE) ×3 IMPLANT
TOWEL OR NON WOVEN STRL DISP B (DISPOSABLE) ×3 IMPLANT
TRAY FOLEY MTR SLVR 16FR STAT (SET/KITS/TRAYS/PACK) IMPLANT
TRAY LAPAROSCOPIC (CUSTOM PROCEDURE TRAY) ×3 IMPLANT
TROCAR ADV FIXATION 5X100MM (TROCAR) ×3 IMPLANT
TROCAR BLADELESS OPT 5 100 (ENDOMECHANICALS) IMPLANT
TROCAR HASSON 5MM (TROCAR) ×3 IMPLANT
TROCAR XCEL BLUNT TIP 100MML (ENDOMECHANICALS) IMPLANT
TROCAR XCEL NON-BLD 11X100MML (ENDOMECHANICALS) IMPLANT
WATER STERILE IRR 1000ML POUR (IV SOLUTION) ×3 IMPLANT
YANKAUER SUCT BULB TIP NO VENT (SUCTIONS) IMPLANT

## 2019-02-22 NOTE — Op Note (Addendum)
02/22/2019  11:39 AM  PATIENT:  Ashley Gentry, 43 y.o., female, MRN: [id number]  PREOP DIAGNOSIS:  small bowel obstruction  POSTOP DIAGNOSIS:   Small bowel obstruction secondary to internal hernia in mesenteric defect in mesentery of distal left colon  PROCEDURE:   Procedure(s): LAPAROSCOPY DIAGNOSTIC, enterlysis adhesions, repair internal hernia [photos of the surgery are in her chart]  SURGEON:   Alphonsa Overall, M.D.  ASSISTANT:   None  ANESTHESIA:   general  Anesthesiologist: Pervis Hocking, DO CRNA: Montel Clock, CRNA  General  EBL:  minimal  ml  BLOOD ADMINISTERED: none  DRAINS: none   LOCAL MEDICATIONS USED:   30 cc 1/4% marcaine  SPECIMEN:   None  COUNTS CORRECT:  YES  INDICATIONS FOR PROCEDURE:  Ashley Gentry is a 43 y.o. (DOB: 16-Apr-1975) [place] female whose primary care physician is Chapman Moss, MD and comes for laparoscopic exploration for a SBO that has not improved with conservative management.   The indications and risks of the surgery were explained to the patient.  The risks include, but are not limited to, infection, bleeding, and nerve injury.  PROCEDURE: The patient was taken to room #1 at [facility].  Her colostomy was sewn closed with 2-0 silk suture.  Her abdomen was prepped with ChloraPrep and Betadine.  A timeout was held and the surgical checklist run.  I accessed her abdominal cavity through an infraumbilical incision.  I used a 5 mm balloon Hassan trocar to get into the abdominal cavityand insufflate the abdomen.  I placed 4 additional trochars: one 5 mm trocar in the right upper quadrant, one 5 mm trocar in the right lower quadrant, one 5 mm trocar in the suprapubic area, and one 5 mm trocar in the left lower quadrant.  She had some omental adhesions to the right anterior abdominal wall.  I took these down with cautery.  She has a small hernia at her colostomy site, this was not causing problems.  I ran the small bowel  from the terminal ileum back to the ligament of Treitz.  Her distal two thirds of her small bowel was decompressed.  About 1/3 of the way from her ligament of Tritz, she had small bowel trapped in an internal hernia at the base of her distal left colon and sigmoid colon.  This was at the base of the mesentery that came up to the colostomy.  The small bowel was beat up, but not compromised.  I was able to reduce the small bowel.  She had an approximate 5 cm defect near the base of the colon mesentery at the junction of the distal left colon and sigmoid colon.  I closed this defect with three 2-0 silk sutures in a figure-of-eight pattern.  I then ran the small bowel again and found no other small bowel issue.  I made sure that her stomach was decompressed and then we removed the NGT.  I took photos of the defect, but they were not able to push the photos into Epic.  So I printed the photos and put them in the paper chart.  I removed the trochars.  I closed the umbilical wound with 2-0 Vicryl sutures.  I closed the skin wounds with 4-0 Monocryl sutures.  I painted the wounds with Dermabond.  Alphonsa Overall, MD, [facility] Office phone:  [phone]

## 2019-02-22 NOTE — Progress Notes (Addendum)
[facility]:  [phone] General Surgery Progress Note   LOS: 8 days  POD -     Chief Complaint: Abbominal pain  Assessment and Plan: 1.  SBO - originally improved, now worse again  CT scan - 02/20/2019 - raises question of closed loop obstruction  WBC - 9,900 - 02/22/2019  Repeat KUB today pending - clinically has not made any progress   Her KUB shows no improvement - plan abdominal exploration today.  I will start laparoscopically.  I discussed the possibility of open surgery and bowel resection.  I talked to her parents on the phone.  2.  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 Roxan Hockeyobinson, [facility], for chronic constipation/stimulation issues. 4.  Anxiety/depression 5.  Hx DVT (in prior PICC) 6.  Hx nephrolithiasis 7.  Neurogenic bladder with chronic suprapubic catheter 8.  History of bipolar disease 9.  DVT prophylaxis - Lovenox   Principal Problem:   SBO (small bowel obstruction) (HCC) Active Problems:   Gastroesophageal reflux disease   Paraplegia (HCC)   Bipolar II disorder (HCC)   ADHD, predominantly inattentive type   Hypokalemia  Subjective:  In no pain, but NGT bothers her.  No stool in ostomy bag.  [place] in [place].  [facility].  [place].  Objective:   Vitals:   02/21/19 2154 02/22/19 0535  BP: (!) 143/87 126/82  Pulse: 80 (!) 102  Resp: 18 18  Temp: 98.9 F (37.2 C) 98.1 F (36.7 C)  SpO2: 100% 100%     Intake/Output from previous day:  11/21 0701 - 11/22 0700 In: 1638 [P.O.:360; I.V.:1278] Out: 2650 [Urine:1100; Emesis/NG output:1500; Stool:50]  Intake/Output this shift:  No intake/output data recorded.   Physical Exam:   General: WN F who is alert and oriented.    HEENT: Normal. Pupils equal.  Has NGT. Marland Kitchen.   Lungs: Clear   Abdomen: Moderate distention.  Has bowel sounds. Ostomy in LUQ.   Lab Results:     Recent Labs    02/21/19 0325 02/22/19 0512  WBC 10.7* 9.9  HGB 12.0 11.8*  HCT 38.9 38.5  PLT 325 337    BMET   Recent Labs    02/21/19 0325 02/22/19 0512  NA 138 132*  K 3.9 4.3  CL 107 106  CO2 17* 12*  GLUCOSE 56* 60*  BUN 6 <5*  CREATININE 0.46 0.53  CALCIUM 8.9 8.5*    PT/INR  No results for input(s): LABPROT, INR in the last 72 hours.  ABG  No results for input(s): PHART, HCO3 in the last 72 hours.  Invalid input(s): PCO2, PO2   Studies/Results:  Ct Abdomen Pelvis W Contrast  Result Date: 02/20/2019 CLINICAL DATA:  Bowel obstruction. EXAM: CT ABDOMEN AND PELVIS WITH CONTRAST TECHNIQUE: Multidetector CT imaging of the abdomen and pelvis was performed using the standard protocol following bolus administration of intravenous contrast. CONTRAST:  100mL OMNIPAQUE IOHEXOL 300 MG/ML  SOLN COMPARISON:  CT stone study 02/14/2019. FINDINGS: Lower chest: Bibasilar collapse/consolidation, left greater than right, is new in the interval. Hepatobiliary: Small area of low attenuation in the anterior liver, adjacent to the falciform ligament, is in a characteristic location for focal fatty deposition. The liver shows diffusely decreased attenuation suggesting fat deposition. 1.7 x 1.3 cm wedge-shaped subcapsular focus of subtle hyperenhancement is identified in the inferior right liver (27/2) likely transient hepatic attenuation difference  from vascular anomaly. No discernible underlying mass lesion as etiology. There is no evidence for gallstones, gallbladder wall thickening, or pericholecystic fluid. No intrahepatic or extrahepatic biliary dilation. Pancreas: No focal mass lesion. No dilatation of the main duct. No intraparenchymal cyst. No peripancreatic edema. Spleen: No splenomegaly. No focal mass lesion. Adrenals/Urinary Tract: No adrenal nodule or mass. Bilateral nonobstructing renal stones again identified measuring up to 7 mm in the left kidney. No suspicious enhancing renal  mass lesion. There is a 2 x 2 x 4 mm stone noted in the right UPJ on today's exam which was not present previously and is visible on image 41 of series 2, also well seen on coronal image 54 of series 5. No evidence for hydroureter. Bladder is decompressed by a suprapubic tube. Stomach/Bowel: Stomach is not markedly distended. The tip of the NG tube is transpyloric, in the descending duodenum, as noted on x-ray from yesterday. Duodenum is not dilated. Jejunum becomes fluid-filled and distended with loops measuring up to about 4.3 cm maximum diameter. In the proximal to mid small bowel, the dilated jejunal loops abruptly transition to completely decompressed (well seen on axial 55/2 and coronal 46/5. This then tracks into a short segment of clustered small bowel showing circumferential wall thickening and confluent interloop mesenteric fluid (image 67/2). This abnormal small bowel than tracks distally into a second transition zone visible on axial 53/2 and coronal 37/5. Mid and distal small bowel beyond this second transition zone is completely decompressed into the terminal ileum. 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 There is contrast material in the colon which is decompressed throughout. No substantial contrast is seen in small bowel loops on the current study. Vascular/Lymphatic: No abdominal aortic aneurysm. No abdominal aortic atherosclerotic calcification. There is no gastrohepatic or hepatoduodenal ligament lymphadenopathy. No intraperitoneal or retroperitoneal lymphadenopathy. No pelvic sidewall lymphadenopathy. Reproductive: Uterus surgically absent.  There is no adnexal mass. Other: Moderate volume free fluid noted in the pelvis. Musculoskeletal: Gas bubbles in the subcutaneous fat of the left abdominal wall presumably from injection. No worrisome lytic or sclerotic osseous abnormality. Extensive thoracolumbar fusion hardware  evident. IMPRESSION: 1. Proximal small bowel loops are dilated up to 4.3 cm diameter. There is an abrupt transition zone in the left abdomen at about the proximal to mid small bowel level. A short segment of clustered small bowel distal to this transition zone shows intraluminal fluid with circumferential wall thickening and adjacent confluent interloop mesenteric fluid. The clustered abnormal small bowel with wall thickening tracks into a second transition zone and small bowel loops distal to this second transition are completely decompressed to the level of the terminal ileum. Given that the small bowel between the 2 transition zones shows abnormal circumferential wall thickening and adjacent mesenteric fluid, imaging features raise concern for closed loop obstruction or internal hernia. Wall thickening in this small-bowel between the transition zones could reflect ischemia although no pneumatosis is evident at this time. 2. Contrast material is visible in the colon. Contrast material was visible in the colon on abdomen film of 02/16/2019 performed as part of a small bowel obstruction protocol. Small bowel distension on x-ray from 02/19/2019 is clearly progressive when comparing to that earlier film of 02/16/2019. 3. NG tube tip is in the descending duodenum. Proximal side port of the NG tube is probably transpyloric. 4. Bilateral renal stones with a new 2 x 2 x 4 mm right UPJ stone since 02/14/2019. This stone  causes no substantial right hydronephrosis at this time. 5. Interval development of bibasilar collapse/consolidation in the lower lungs. Electronically Signed   By: Misty Stanley M.D.   On: 02/20/2019 13:00   Dg Abd Portable 1v  Result Date: 02/21/2019 CLINICAL DATA:  Small bowel obstruction. EXAM: PORTABLE ABDOMEN - 1 VIEW COMPARISON:  CT yesterday. FINDINGS: Tip and side port of the enteric tube below the diaphragm in the stomach. Dilated small bowel in the central abdomen again seen, not  significantly changed from CT yesterday. Contrast in the colon again seen, also present on CT. No evidence of free air. Lung bases are clear. IMPRESSION: Persistent small bowel obstruction with unchanged small bowel dilatation. Electronically Signed   By: Keith Rake M.D.   On: 02/21/2019 05:48     Anti-infectives:   Anti-infectives (From admission, onward)   Start     Dose/Rate Route Frequency Ordered Stop   02/16/19 1100  ciprofloxacin (CIPRO) IVPB 400 mg  Status:  Discontinued     400 mg 200 mL/hr over 60 Minutes Intravenous Every 12 hours 02/16/19 0919 02/16/19 1112   02/15/19 1000  erythromycin (E-MYCIN) tablet 250 mg  Status:  Discontinued    Note to Pharmacy: 1-2 daily     250 mg Oral Daily 02/15/19 0319 02/15/19 1528   02/15/19 0800  ciprofloxacin (CIPRO) tablet 500 mg  Status:  Discontinued     500 mg Oral 2 times daily 02/15/19 0319 02/16/19 0919      Alphonsa Overall, MD, [facility] Office: [phone] 02/22/2019
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
## 2019-02-22 NOTE — Transfer of Care (Signed)
Immediate Anesthesia Transfer of Care Note  Patient: Benay Pomeroy  Procedure(s) Performed: LAPAROSCOPY DIAGNOSTIC enterlysis adhesions repair internal hernia (N/A Abdomen) EXPLORATORY LAPAROTOMY (N/A )  Patient Location: PACU  Anesthesia Type:General  Level of Consciousness: drowsy and patient cooperative  Airway & Oxygen Therapy: Patient Spontanous Breathing and Patient connected to face mask oxygen  Post-op Assessment: Report given to RN and Post -op Vital signs reviewed and stable  Post vital signs: Reviewed and stable  Last Vitals:  Vitals Value Taken Time  BP 139/91 02/22/19 1146  Temp    Pulse 104 02/22/19 1145  Resp 18 02/22/19 1149  SpO2 86 % 02/22/19 1145  Vitals shown include unvalidated device data.  Last Pain:  Vitals:   02/22/19 0758  TempSrc:   PainSc: Asleep      Patients Stated Pain Goal: 2 (40/10/27 2536)  Complications: No apparent anesthesia complications
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
## 2019-02-22 NOTE — Anesthesia Procedure Notes (Signed)
Procedure Name: Intubation Date/Time: 02/22/2019 10:01 AM Performed by: Montel Clock, CRNA Pre-anesthesia Checklist: Patient identified, Emergency Drugs available, Suction available, Patient being monitored and Timeout performed Patient Re-evaluated:Patient Re-evaluated prior to induction Oxygen Delivery Method: Circle system utilized Preoxygenation: Pre-oxygenation with 100% oxygen Induction Type: IV induction and Rapid sequence Laryngoscope Size: Mac and 3 Grade View: Grade I Tube type: Oral Tube size: 7.0 mm Number of attempts: 1 Airway Equipment and Method: Stylet Placement Confirmation: ETT inserted through vocal cords under direct vision,  positive ETCO2 and breath sounds checked- equal and bilateral Secured at: 21 cm Tube secured with: Tape Dental Injury: Teeth and Oropharynx as per pre-operative assessment

## 2019-02-22 NOTE — Anesthesia Postprocedure Evaluation (Signed)
Anesthesia Post Note  Patient: Amarianna Abplanalp  Procedure(s) Performed: LAPAROSCOPY DIAGNOSTIC enterlysis adhesions repair internal hernia (N/A Abdomen) EXPLORATORY LAPAROTOMY (N/A )     Patient location during evaluation: PACU Anesthesia Type: General Level of consciousness: awake and alert, oriented and patient cooperative Pain management: pain level controlled Vital Signs Assessment: post-procedure vital signs reviewed and stable Respiratory status: spontaneous breathing, nonlabored ventilation and respiratory function stable Cardiovascular status: blood pressure returned to baseline and stable Postop Assessment: no apparent nausea or vomiting Anesthetic complications: no    Last Vitals:  Vitals:   02/22/19 0535 02/22/19 1145  BP: 126/82 (!) 139/91  Pulse: (!) 102   Resp: 18   Temp: 36.7 C 36.5 C  SpO2: 100% 98%    Last Pain:  Vitals:   02/22/19 1200  TempSrc:   PainSc: 0-No pain                 Jarome Matin Malaquias Lenker

## 2019-02-23 ENCOUNTER — Encounter (HOSPITAL_COMMUNITY): Payer: Self-pay | Admitting: Surgery

## 2019-02-23 LAB — CBC
HCT: 37.4 % (ref 36.0–46.0)
Hemoglobin: 11.4 g/dL — ABNORMAL LOW (ref 12.0–15.0)
MCH: 28.6 pg (ref 26.0–34.0)
MCHC: 30.5 g/dL (ref 30.0–36.0)
MCV: 93.7 fL (ref 80.0–100.0)
Platelets: 314 10*3/uL (ref 150–400)
RBC: 3.99 MIL/uL (ref 3.87–5.11)
RDW: 14.3 % (ref 11.5–15.5)
WBC: 8.9 10*3/uL (ref 4.0–10.5)
nRBC: 0 % (ref 0.0–0.2)

## 2019-02-23 LAB — BASIC METABOLIC PANEL
Anion gap: 10 (ref 5–15)
BUN: <5 mg/dL — ABNORMAL LOW (ref 6–20)
CO2: 20 mmol/L — ABNORMAL LOW (ref 22–32)
Calcium: 8.5 mg/dL — ABNORMAL LOW (ref 8.9–10.3)
Chloride: 107 mmol/L (ref 98–111)
Creatinine, Ser: 0.47 mg/dL (ref 0.44–1.00)
GFR calc Af Amer: >60 mL/min (ref >60)
GFR calc non Af Amer: >60 mL/min (ref >60)
Glucose, Bld: 87 mg/dL (ref 70–99)
Potassium: 3.4 mmol/L — ABNORMAL LOW (ref 3.5–5.1)
Sodium: 137 mmol/L (ref 135–145)

## 2019-02-23 LAB — MAGNESIUM: Magnesium: 1.8 mg/dL (ref 1.7–2.4)

## 2019-02-23 MED ORDER — POTASSIUM CHLORIDE 10 MEQ/100ML IV SOLN
10.0000 meq | INTRAVENOUS | Status: AC
  Administered 2019-02-23 (×4): 10 meq via INTRAVENOUS
  Filled 2019-02-23: qty 100

## 2019-02-23 MED ORDER — POTASSIUM CHLORIDE IN NACL 40-0.9 MEQ/L-% IV SOLN
INTRAVENOUS | Status: DC
  Administered 2019-02-23 – 2019-03-05 (×12): 75 mL/h via INTRAVENOUS
  Filled 2019-02-23 (×18): qty 1000

## 2019-02-23 MED ORDER — ENOXAPARIN SODIUM 40 MG/0.4ML ~~LOC~~ SOLN
40.0000 mg | SUBCUTANEOUS | Status: DC
  Administered 2019-02-23 – 2019-03-05 (×10): 40 mg via SUBCUTANEOUS
  Filled 2019-02-23 (×10): qty 0.4

## 2019-02-23 NOTE — Progress Notes (Addendum)
1 Day Post-Op    CC: Abdominal pain  Subjective: Patient is doing okay this a.m. complaining of some pain.  She said she had some ice chips and broth last night although she says she has been n.p.o.  There is nothing in her ostomy bag this AM.  She still a bit distended, bowel sounds are hyperactive.  Port sites all look good.  Objective: Vital signs in last 24 hours: Temp:  [97.7 F (36.5 C)-99 F (37.2 C)] 98.4 F (36.9 C) (11/23 0629) Pulse Rate:  [75-104] 82 (11/23 0629) Resp:  [6-19] 16 (11/23 0629) BP: (119-139)/(74-91) 119/77 (11/23 0629) SpO2:  [96 %-100 %] 98 % (11/23 0629) Weight:  [66.4 kg] 66.4 kg (11/23 0629) Last BM Date: 02/21/19 120 PO  2500 IV 2835 urine No Bm, Afebrile, VSS K+ 3.4/Mag 1.8 CBC stable    Intake/Output from previous day: 11/22 0701 - 11/23 0700 In: 2657.6 [P.O.:120; I.V.:2537.6] Out: 2860 [Urine:2835; Blood:25] Intake/Output this shift: No intake/output data recorded.  General appearance: alert, cooperative and no distress Resp: clear to auscultation bilaterally and She needs to be on incentive spirometry GI: Slightly distended.  Bowel sounds are hyperactive.  There is nothing in her ostomy bag this AM.  Lab Results:  Recent Labs    02/22/19 0512 02/23/19 0310  WBC 9.9 8.9  HGB 11.8* 11.4*  HCT 38.5 37.4  PLT 337 314    BMET Recent Labs    02/22/19 0512 02/23/19 0310  NA 132* 137  K 4.3 3.4*  CL 106 107  CO2 12* 20*  GLUCOSE 60* 87  BUN <5* <5*  CREATININE 0.53 0.47  CALCIUM 8.5* 8.5*   PT/INR No results for input(s): LABPROT, INR in the last 72 hours.  Recent Labs  Lab 02/17/19 0358  AST 16  ALT 16  ALKPHOS 97  BILITOT 0.9  PROT 6.3*  ALBUMIN 3.3*     Lipase     Component Value Date/Time   LIPASE 26 02/14/2019 1705     Medications: . diphenhydrAMINE  25 mg Oral Once  . lamoTRIgine  200 mg Oral Daily  . lithium carbonate  300 mg Oral QHS  . methylphenidate  36 mg Oral QAC breakfast  .  pantoprazole (PROTONIX) IV  40 mg Intravenous Q24H  . pregabalin  200 mg Oral TID  . QUEtiapine  200 mg Oral QHS  . sodium chloride flush  3 mL Intravenous Once   . 0.9 % NaCl with KCl 20 mEq / L 75 mL/hr at 02/23/19 0600    Assessment/Plan S/PE robotic in colostomy 12/2018 Dr. Lyndel Pleasure colorectal surgery for chronic constipation/stimulation issues. Anxiety/depression/bipolar disease Hx DVT Hx nephrolithiasis/hydronephrosis Neurogenic bladder with chronic suprapubic catheter Hyponatremia/hypochloremia - improved Hypokalemia  - Get K+ up to 4.0 range  Partial small bowel obstruction Diagnostic laparoscopy, enterolysis of adhesions, repair of internal hernia 02/22/2019 Dr. Alphonsa Overall, POD #1 -Dilated small bowel loops in the left abdomen and pelvis/transition LLQ -Patient refused NG/oral Gastrografin for small bowel protocol-no contrast in colon after 8 hours -NG placed and confirmed in the distal stomach last p.m. -Small bowel protocol shows contrast in the colon and the ostomy bag on 02/16/2019 -Diet advanced/no surgical indication -New small bowel dilatation 02/18/2019; film this a.m. shows ongoing bowel dilatation and stomach looks like it full of air. - CT  11/20: Small bowel dilatation up to 4.3 cm./Concern for closed-loop obstruction or internal hernia  FEN:IV fluids/n.p.o. ID: None DVT: Lovenox restart at 1600 Follow-up: Dr. Bosie Clos. Robinson([facility])  Plan: Dr. Ezzard Standing would like to advance her diet rather quickly but right now there is nothing in her ostomy bag.  I will put her on sips and chips for now and we will watch her progress.  Work on getting her out of bed frequently, and add incentive spirometry.  I added more K+ to next bag of IV fluid.       LOS: 9 days    Ashley Gentry 02/23/2019 Please see Amion
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
## 2019-02-23 NOTE — Progress Notes (Signed)
Brief Pharmacy note: Lovenox  Requested Lovenox 40mg  daily resume today at 1600, order entered Lovenox was held for surgery 11/22  Will sign off procotol  Thank you,  Minda Ditto PharmD 02/23/2019, 8:01 AM
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
## 2019-02-24 ENCOUNTER — Inpatient Hospital Stay (HOSPITAL_COMMUNITY): Payer: BC Managed Care – PPO

## 2019-02-24 LAB — CBC
HCT: 39.1 % (ref 36.0–46.0)
Hemoglobin: 12 g/dL (ref 12.0–15.0)
MCH: 28.8 pg (ref 26.0–34.0)
MCHC: 30.7 g/dL (ref 30.0–36.0)
MCV: 94 fL (ref 80.0–100.0)
Platelets: 328 10*3/uL (ref 150–400)
RBC: 4.16 MIL/uL (ref 3.87–5.11)
RDW: 14.7 % (ref 11.5–15.5)
WBC: 8.6 10*3/uL (ref 4.0–10.5)
nRBC: 0 % (ref 0.0–0.2)

## 2019-02-24 LAB — BASIC METABOLIC PANEL
Anion gap: 14 (ref 5–15)
BUN: <5 mg/dL — ABNORMAL LOW (ref 6–20)
CO2: 21 mmol/L — ABNORMAL LOW (ref 22–32)
Calcium: 8.9 mg/dL (ref 8.9–10.3)
Chloride: 100 mmol/L (ref 98–111)
Creatinine, Ser: 0.37 mg/dL — ABNORMAL LOW (ref 0.44–1.00)
GFR calc Af Amer: >60 mL/min (ref >60)
GFR calc non Af Amer: >60 mL/min (ref >60)
Glucose, Bld: 76 mg/dL (ref 70–99)
Potassium: 3.4 mmol/L — ABNORMAL LOW (ref 3.5–5.1)
Sodium: 135 mmol/L (ref 135–145)

## 2019-02-24 LAB — MAGNESIUM: Magnesium: 1.7 mg/dL (ref 1.7–2.4)

## 2019-02-24 MED ORDER — ALUM & MAG HYDROXIDE-SIMETH 200-200-20 MG/5ML PO SUSP
30.0000 mL | Freq: Four times a day (QID) | ORAL | Status: DC | PRN
  Administered 2019-02-24 – 2019-02-27 (×3): 30 mL via ORAL
  Filled 2019-02-24 (×3): qty 30

## 2019-02-24 MED ORDER — POTASSIUM CHLORIDE 10 MEQ/100ML IV SOLN
10.0000 meq | INTRAVENOUS | Status: AC
  Administered 2019-02-24 (×5): 10 meq via INTRAVENOUS
  Filled 2019-02-24: qty 100

## 2019-02-24 MED ORDER — TIZANIDINE HCL 4 MG PO TABS
4.0000 mg | ORAL_TABLET | Freq: Three times a day (TID) | ORAL | Status: DC
  Administered 2019-02-24 – 2019-02-27 (×11): 4 mg via ORAL
  Filled 2019-02-24 (×11): qty 1

## 2019-02-24 MED ORDER — SIMETHICONE 80 MG PO CHEW
160.0000 mg | CHEWABLE_TABLET | Freq: Once | ORAL | Status: AC
  Administered 2019-02-24: 160 mg via ORAL
  Filled 2019-02-24: qty 2

## 2019-02-24 MED ORDER — ACETAMINOPHEN-CODEINE #3 300-30 MG PO TABS
1.0000 | ORAL_TABLET | ORAL | Status: DC | PRN
  Administered 2019-02-24 – 2019-02-26 (×5): 1 via ORAL
  Administered 2019-02-26: 22:00:00 2 via ORAL
  Administered 2019-02-27: 1 via ORAL
  Administered 2019-03-01 – 2019-03-05 (×3): 2 via ORAL
  Filled 2019-02-24 (×5): qty 1
  Filled 2019-02-24 (×3): qty 2
  Filled 2019-02-24: qty 1
  Filled 2019-02-24: qty 2
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
## 2019-02-24 NOTE — Progress Notes (Signed)
2 Days Post-Op    CC: Abdominal pain  Subjective: Still nothing in her ostomy bag.  Is complaining of abdominal pain and back pain.    Objective: Vital signs in last 24 hours: Temp:  [97.8 F (36.6 C)-98.2 F (36.8 C)] 97.8 F (36.6 C) (11/24 0645) Pulse Rate:  [88-119] 96 (11/24 0645) Resp:  [16-18] 16 (11/24 0645) BP: (110-130)/(61-87) 129/86 (11/24 0645) SpO2:  [98 %-100 %] 98 % (11/24 0645) Last BM Date: 02/21/19 260 p.o. recorded 2000 IV 40-50 urine No stool recorded Afebrile vital signs are stable K+ 3.4, mag 1.7 CBC stable Pain/anxiety control 11/23:Dilaudid 1 mg x 4, Lamictal 200 mg, lithium 300 mg, Robaxin 500 mg, Concerta 36 mg, Lyrica 200 mg x 3, Seroquel 200 mg daily Intake/Output from previous day: 11/23 0701 - 11/24 0700 In: 2242.5 [P.O.:260; I.V.:1752.7; IV Piggyback:229.8] Out: 4250 [Urine:4250] Intake/Output this shift: Total I/O In: 1023.5 [P.O.:140; I.V.:883.5] Out: 2900 [Urine:2900]  General appearance: alert and cooperative, no distress, anxious Resp: clear to auscultation bilaterally GI: soft, but still somewhat distended.  Few BS, no gas or stool in the ostomy bag.    Lab Results:  Recent Labs    02/23/19 0310 02/24/19 0405  WBC 8.9 8.6  HGB 11.4* 12.0  HCT 37.4 39.1  PLT 314 328    BMET Recent Labs    02/23/19 0310 02/24/19 0405  NA 137 135  K 3.4* 3.4*  CL 107 100  CO2 20* 21*  GLUCOSE 87 76  BUN <5* <5*  CREATININE 0.47 0.37*  CALCIUM 8.5* 8.9   PT/INR No results for input(s): LABPROT, INR in the last 72 hours.  No results for input(s): AST, ALT, ALKPHOS, BILITOT, PROT, ALBUMIN in the last 168 hours.   Lipase     Component Value Date/Time   LIPASE 26 02/14/2019 1705     Medications: . diphenhydrAMINE  25 mg Oral Once  . enoxaparin (LOVENOX) injection  40 mg Subcutaneous Q24H  . lamoTRIgine  200 mg Oral Daily  . lithium carbonate  300 mg Oral QHS  . methylphenidate  36 mg Oral QAC breakfast  . pantoprazole  (PROTONIX) IV  40 mg Intravenous Q24H  . pregabalin  200 mg Oral TID  . QUEtiapine  200 mg Oral QHS  . sodium chloride flush  3 mL Intravenous Once    Assessment/Plan S/PE robotic in colostomy 12/2018 Dr. Jori Moll colorectal surgery for chronic constipation/stimulation issues. Anxiety/depression/bipolar disease Hx DVT Hx nephrolithiasis/hydronephrosis Neurogenic bladder with chronic suprapubic catheter Hyponatremia/hypochloremia - improved Hypokalemia  - Get K+ up to 4.0 range  Partial small bowel obstruction Diagnostic laparoscopy, enterolysis of adhesions, repair of internal hernia 02/22/2019 Dr. Ovidio Kin, POD #2 -Dilated small bowel loops in the left abdomen and pelvis/transition LLQ -Patient refused NG/oral Gastrografin for small bowel protocol-no contrast in colon after 8 hours -NG placed and confirmed in the distal stomach last p.m. -Small bowel protocol shows contrast in the colon and the ostomy bag on 02/16/2019 -Diet advanced/no surgical indication -New small bowel dilatation 02/18/2019; film this a.m. shows ongoing bowel dilatation and stomach looks like it full of air. - CT  11/20: Small bowel dilatation up to 4.3 cm./Concern for closed-loop obstruction or internal hernia  FEN:IV fluids/n.p.o. ID: None DVT: Lovenox  Follow-up: Dr. Juanetta Beets. Robinson([facility])  Plan:  Work on her pain control, she is taking some PO meds already.  Resume her Zanaflex, add Tylenol #3.  She is pretty anxious about all of this but I have explained we just  have to wait for her bowel function to return.         LOS: 10 days    Ashley Gentry 02/24/2019 Please see Amion
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
## 2019-02-25 LAB — BASIC METABOLIC PANEL
Anion gap: 12 (ref 5–15)
BUN: <5 mg/dL — ABNORMAL LOW (ref 6–20)
CO2: 21 mmol/L — ABNORMAL LOW (ref 22–32)
Calcium: 8.8 mg/dL — ABNORMAL LOW (ref 8.9–10.3)
Chloride: 102 mmol/L (ref 98–111)
Creatinine, Ser: 0.47 mg/dL (ref 0.44–1.00)
GFR calc Af Amer: >60 mL/min (ref >60)
GFR calc non Af Amer: >60 mL/min (ref >60)
Glucose, Bld: 77 mg/dL (ref 70–99)
Potassium: 3.7 mmol/L (ref 3.5–5.1)
Sodium: 135 mmol/L (ref 135–145)

## 2019-02-25 LAB — CBC
HCT: 39.9 % (ref 36.0–46.0)
Hemoglobin: 12.4 g/dL (ref 12.0–15.0)
MCH: 28.9 pg (ref 26.0–34.0)
MCHC: 31.1 g/dL (ref 30.0–36.0)
MCV: 93 fL (ref 80.0–100.0)
Platelets: 341 10*3/uL (ref 150–400)
RBC: 4.29 MIL/uL (ref 3.87–5.11)
RDW: 14.7 % (ref 11.5–15.5)
WBC: 6.9 10*3/uL (ref 4.0–10.5)
nRBC: 0 % (ref 0.0–0.2)

## 2019-02-25 LAB — MAGNESIUM: Magnesium: 1.8 mg/dL (ref 1.7–2.4)

## 2019-02-25 MED ORDER — BOOST / RESOURCE BREEZE PO LIQD CUSTOM: 1.0000 | Freq: Three times a day (TID) | ORAL | Status: DC

## 2019-02-25 MED ORDER — ADULT MULTIVITAMIN LIQUID CH
15.0000 mL | Freq: Every day | ORAL | Status: DC
  Administered 2019-02-26 – 2019-03-03 (×5): 15 mL via ORAL
  Filled 2019-02-25 (×9): qty 15

## 2019-02-25 MED ORDER — PRO-STAT SUGAR FREE PO LIQD
30.0000 mL | Freq: Three times a day (TID) | ORAL | Status: DC
  Administered 2019-02-26 – 2019-03-05 (×8): 30 mL via ORAL
  Filled 2019-02-25 (×11): qty 30
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
## 2019-02-25 NOTE — Progress Notes (Signed)
   02/25/19 1100  Clinical Encounter Type  Visited With Patient  Visit Type Initial;Psychological support;Spiritual support  Referral From Nurse  Consult/Referral To Chaplain  Spiritual Encounters  Spiritual Needs Emotional;Other (Comment) (Spiritual Care Conversation/Support/Notarization)  Stress Factors  Patient Stress Factors Health changes;Major life changes  Advance Directives (For Healthcare)  Does Patient Have a Medical Advance Directive? Yes  Type of Advance Directive Living will   I visited with [place] per spiritual care consult from nightshift Chaplain and nurse. Nayeliz wanted to complete a Living Will document. I procured 2 witnesses and notarized the document. A copy was placed in her medical record.  Loveta also states that she wants to be made a DNR. I relayed the message to the nurse.   Please, contact Spiritual Care for further assistance.   Chaplain Shanon Ace M.Div., [facility]

## 2019-02-25 NOTE — Progress Notes (Signed)
Ashley Gentry was lying in bed awake, and alert when I arrived. She talked about her day and was pleased she was able to complete her living will. She talked about her relationship with her SO. She asked for prayer and when asked if she had anything on her heart for which she wanted prayer she said the ability to trust and forgive. She shared with me that her SO had been unfaithful with her best friend. She and her SO Quita Skye) speak but she said her best friend does not speak to her and she is having a difficult time bringing closure to that relationship and the event. She wanted Quita Skye to be part of the prayer. She called and placed him on speaker during our prayer time. Both were very appreciative. He was very supportive and thankful that she included him in the prayer. Pt is very positive and pleasant to visit. I provided listening and pastoral presence and empathic support. Please page if additional support is needed. [place], [facility]   02/25/19 2100  Clinical Encounter Type  Visited With Patient
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
## 2019-02-25 NOTE — Progress Notes (Signed)
3 Days Post-Op    CC: Abdominal pain  Subjective: She has had some gas, but no stool so far.  She has good bowel sounds.  She is not tender minimal if any distention.   Objective: Vital signs in last 24 hours: Temp:  [97.6 F (36.4 C)-98.8 F (37.1 C)] 97.9 F (36.6 C) (11/25 0641) Pulse Rate:  [76-96] 76 (11/25 0641) Resp:  [14-17] 15 (11/25 0641) BP: (98-117)/(65-77) 98/65 (11/25 0641) SpO2:  [94 %-97 %] 95 % (11/25 0641) Last BM Date: 02/21/19 30 p.o. 1900 IV 2330 urine Stool recorded Afebrile vital signs are stable CBC/CMP are essentially normal Intake/Output from previous day: 11/24 0701 - 11/25 0700 In: 1983.8 [P.O.:30; I.V.:1781.3; IV Piggyback:172.6] Out: 2350 [Urine:2350] Intake/Output this shift: No intake/output data recorded.  General appearance: alert, cooperative and no distress Resp: clear to auscultation bilaterally GI: Soft, nontender, positive bowel sounds positive flatus no BM so far.  Her port sites all look fine.  Lab Results:  Recent Labs    02/24/19 0405 02/25/19 0345  WBC 8.6 6.9  HGB 12.0 12.4  HCT 39.1 39.9  PLT 328 341    BMET Recent Labs    02/24/19 0405 02/25/19 0345  NA 135 135  K 3.4* 3.7  CL 100 102  CO2 21* 21*  GLUCOSE 76 77  BUN <5* <5*  CREATININE 0.37* 0.47  CALCIUM 8.9 8.8*   PT/INR No results for input(s): LABPROT, INR in the last 72 hours.  No results for input(s): AST, ALT, ALKPHOS, BILITOT, PROT, ALBUMIN in the last 168 hours.   Lipase     Component Value Date/Time   LIPASE 26 02/14/2019 1705     Medications: . diphenhydrAMINE  25 mg Oral Once  . enoxaparin (LOVENOX) injection  40 mg Subcutaneous Q24H  . lamoTRIgine  200 mg Oral Daily  . lithium carbonate  300 mg Oral QHS  . methylphenidate  36 mg Oral QAC breakfast  . pantoprazole (PROTONIX) IV  40 mg Intravenous Q24H  . pregabalin  200 mg Oral TID  . QUEtiapine  200 mg Oral QHS  . sodium chloride flush  3 mL Intravenous Once  . tiZANidine  4  mg Oral TID    Assessment/Plan S/PE robotic in colostomy 12/2018 Dr. Lyndel Pleasure colorectal surgery for chronic constipation/stimulation issues. Anxiety/depression/bipolar disease Hx DVT Hx nephrolithiasis/hydronephrosis Neurogenic bladder with chronic suprapubic catheter Hyponatremia/hypochloremia- improved Hypokalemia - Get K+ up to 4.0 range  Partial small bowel obstruction Diagnostic laparoscopy, enterolysis of adhesions, repair of internal hernia 02/22/2019 Dr. Alphonsa Overall, POD #2 -Dilated small bowel loops in the left abdomen and pelvis/transition LLQ -Patient refused NG/oral Gastrografin for small bowel protocol-no contrast in colon after 8 hours -NG placed and confirmed in the distal stomach last p.m. -Small bowel protocol shows contrast in the colon and the ostomy bag on 02/16/2019 -Diet advanced/no surgical indication -New small bowel dilatation 02/18/2019; film this a.m. shows ongoing bowel dilatation and stomach looks like it full of air. - CT 11/20:Small bowel dilatation up to 4.3 cm./Concern for closed-loop obstruction or internal hernia  -  Post op ileus  FEN:IV fluids/n.p.o. ID: None DVT: Lovenox Follow-up:Dr. Newman/Dr. Robinson([facility])   Plan: I will put her on clear liquids and see how she does.  I told her if she has any nausea or distention or fullness to back off on the volume of clear liquids.       LOS: 11 days    Haruto Demaria 02/25/2019 Please see Amion

## 2019-02-26 MED ORDER — METHOCARBAMOL 500 MG PO TABS
500.0000 mg | ORAL_TABLET | Freq: Once | ORAL | Status: AC
  Administered 2019-02-26: 500 mg via ORAL
  Filled 2019-02-26: qty 1

## 2019-02-26 MED ORDER — METHYLPHENIDATE HCL ER (OSM) 18 MG PO TBCR
36.0000 mg | EXTENDED_RELEASE_TABLET | Freq: Every day | ORAL | Status: DC
  Administered 2019-02-26 – 2019-02-27 (×2): 36 mg via ORAL
  Filled 2019-02-26 (×2): qty 2

## 2019-02-26 MED ORDER — DICYCLOMINE HCL 10 MG/ML IM SOLN: 20.0000 mg | Freq: Once | INTRAMUSCULAR | Status: DC

## 2019-02-26 NOTE — Progress Notes (Signed)
Assessment & Plan: POD#3 - laparoscopic enterolysis of adhesions, repair of internal hernia 02/22/2019 - Dr. Ovidio Kin  Tolerating clear liquids overnight, output in ostomy bag  Advance to full liquid diet today  Encourage OOB, up to chair today  Will follow with you        Darnell Level, MD       [facility]       Office: [phone]   Chief Complaint: Partial small bowel obstruction, closed loop obstruction from internal hernia  Subjective: Patient in bed, pleasant, no complaints.  Tolerating clear liquids, anxious to advance.  Objective: Vital signs in last 24 hours: Temp:  [98 F (36.7 C)-98.3 F (36.8 C)] 98.1 F (36.7 C) (11/26 0535) Pulse Rate:  [85-94] 91 (11/26 0535) Resp:  [16-18] 18 (11/26 0535) BP: (93-109)/(68-74) 93/74 (11/26 0535) SpO2:  [96 %-100 %] 96 % (11/26 0535) Weight:  [64.7 kg] 64.7 kg (11/26 0500) Last BM Date: 02/21/19  Intake/Output from previous day: 11/25 0701 - 11/26 0700 In: 3455.2 [P.O.:1800; I.V.:1655.2] Out: 2700 [Urine:2525; Stool:175] Intake/Output this shift: No intake/output data recorded.  Physical Exam: HEENT - sclerae clear, mucous membranes moist Neck - soft Abdomen - protuberant, soft, output in ostomy bag; active BS present  Lab Results:  Recent Labs    02/24/19 0405 02/25/19 0345  WBC 8.6 6.9  HGB 12.0 12.4  HCT 39.1 39.9  PLT 328 341   BMET Recent Labs    02/24/19 0405 02/25/19 0345  NA 135 135  K 3.4* 3.7  CL 100 102  CO2 21* 21*  GLUCOSE 76 77  BUN <5* <5*  CREATININE 0.37* 0.47  CALCIUM 8.9 8.8*   PT/INR No results for input(s): LABPROT, INR in the last 72 hours. Comprehensive Metabolic Panel:    Component Value Date/Time   NA 135 02/25/2019 0345   NA 135 02/24/2019 0405   K 3.7 02/25/2019 0345   K 3.4 (L) 02/24/2019 0405   CL 102 02/25/2019 0345   CL 100 02/24/2019 0405   CO2 21 (L) 02/25/2019 0345   CO2 21 (L) 02/24/2019 0405   BUN <5 (L) 02/25/2019 0345   BUN <5 (L) 02/24/2019 0405   CREATININE 0.47 02/25/2019 0345   CREATININE 0.37 (L) 02/24/2019 0405   GLUCOSE 77 02/25/2019 0345   GLUCOSE 76 02/24/2019 0405   CALCIUM 8.8 (L) 02/25/2019 0345   CALCIUM 8.9 02/24/2019 0405   AST 16 02/17/2019 0358   AST 19 02/15/2019 0308   ALT 16 02/17/2019 0358   ALT 20 02/15/2019 0308   ALKPHOS 97 02/17/2019 0358   ALKPHOS 121 02/15/2019 0308   BILITOT 0.9 02/17/2019 0358   BILITOT 0.5 02/15/2019 0308   PROT 6.3 (L) 02/17/2019 0358   PROT 7.4 02/15/2019 0308   ALBUMIN 3.3 (L) 02/17/2019 0358   ALBUMIN 3.9 02/15/2019 0308    Studies/Results: Dg Abd Portable 1v  Result Date: 02/24/2019 CLINICAL DATA:  43 year old female with small bowel obstruction. EXAM: PORTABLE ABDOMEN - 1 VIEW COMPARISON:  Abdominal radiograph dated 02/21/2019. FINDINGS: Dilated air-filled loops of small bowel measure up to 4.7 cm. There is air and contrast within the colon. No definite free air identified. Partially visualized thoracolumbar fixation hardware. No acute osseous pathology. IMPRESSION: Persistent dilatation of small-bowel loops.  Follow-up recommended. Electronically Signed   By: Elgie Collard M.D.   On: 02/24/2019 13:11      Darnell Level 02/26/2019  Patient ID: Ashley Gentry, female   DOB: [birth year], 43 y.o.  MRN: [id number]

## 2019-02-26 NOTE — Progress Notes (Signed)
Ashley Gentry's father was visiting today. He was very attentive, even washing her clothing for her. They seem to get along really well and she frequently showed appreciation to him. Pt was glad for the visit and appreciates pastoral support. She asked about DNR status and wants to confirm it is in place before she leaves. She also asked about the difference btwn a living will and HCPOA. Presently she has a living will. Please page if additional support is needed prior to discharge. She said she expects to go home tomorrow. [place], [place] - [phone]   02/26/19 1900  Clinical Encounter Type  Visited With Patient and family together

## 2019-02-26 NOTE — Progress Notes (Signed)
Pt was advanced to full liquid diet this morning. Tolerated well. Ostomy putting out.  Pt insisted she be advanced to regular food so the doctors could see how well she is doing and can hopefully go home tomorrow. She was advised against advancing further but she insisted. MD was called, order given.  Encouraged her to take it easy with her food choices.
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
## 2019-02-27 ENCOUNTER — Inpatient Hospital Stay (HOSPITAL_COMMUNITY): Payer: BC Managed Care – PPO

## 2019-02-27 MED ORDER — METHOCARBAMOL 500 MG PO TABS
500.0000 mg | ORAL_TABLET | Freq: Four times a day (QID) | ORAL | Status: DC | PRN
  Administered 2019-02-28 – 2019-03-04 (×4): 500 mg via ORAL
  Filled 2019-02-27 (×5): qty 1

## 2019-02-27 MED ORDER — SIMETHICONE 40 MG/0.6ML PO SUSP
40.0000 mg | Freq: Four times a day (QID) | ORAL | Status: DC | PRN
  Administered 2019-02-27: 40 mg via ORAL
  Filled 2019-02-27 (×3): qty 0.6

## 2019-02-27 NOTE — Progress Notes (Signed)
     Assessment & Plan: POD#4 - laparoscopic enterolysis of adhesions, repair of internal hernia 02/22/2019 - Dr. Alphonsa Overall             Full liquid diet ordered yesterday - patient insisted on regular diet - ate pizza, salad  Would recommend liquids today, maybe soup - my suggestion to patient             Encourage OOB, up to chair today             Will follow with you        [name], MD       [facility]       Office: [phone]   Chief Complaint: Partial SBO, internal hernia  Subjective: Patient in bed, worried.  Abdomen more distended, little ostomy output.  Objective: Vital signs in last 24 hours: Temp:  [97.4 F (36.3 C)-98.2 F (36.8 C)] 98 F (36.7 C) (11/27 0659) Pulse Rate:  [87-100] 87 (11/27 0659) Resp:  [15-16] 15 (11/27 0659) BP: (90-113)/(64-80) 90/64 (11/27 0659) SpO2:  [100 %] 100 % (11/27 0659) Last BM Date: 02/26/19  Intake/Output from previous day: 11/26 0701 - 11/27 0700 In: 4127.2 [P.O.:2300; I.V.:1827.2] Out: 3035 [Urine:2600; Stool:435] Intake/Output this shift: No intake/output data recorded.  Physical Exam: HEENT - sclerae clear, mucous membranes moist Neck - soft Abdomen - protuberant, BS present; little output in ostomy bag; wounds dry and intact   Lab Results:  Recent Labs    02/25/19 0345  WBC 6.9  HGB 12.4  HCT 39.9  PLT 341   BMET Recent Labs    02/25/19 0345  NA 135  K 3.7  CL 102  CO2 21*  GLUCOSE 77  BUN <5*  CREATININE 0.47  CALCIUM 8.8*   PT/INR No results for input(s): LABPROT, INR in the last 72 hours. Comprehensive Metabolic Panel:    Component Value Date/Time   NA 135 02/25/2019 0345   NA 135 02/24/2019 0405   K 3.7 02/25/2019 0345   K 3.4 (L) 02/24/2019 0405   CL 102 02/25/2019 0345   CL 100 02/24/2019 0405   CO2 21 (L) 02/25/2019 0345   CO2 21 (L) 02/24/2019 0405   BUN <5 (L) 02/25/2019 0345   BUN <5 (L) 02/24/2019 0405   CREATININE 0.47 02/25/2019 0345   CREATININE 0.37 (L) 02/24/2019 0405   GLUCOSE 77 02/25/2019 0345   GLUCOSE 76 02/24/2019 0405   CALCIUM 8.8 (L) 02/25/2019 0345   CALCIUM 8.9 02/24/2019 0405   AST 16 02/17/2019 0358   AST 19 02/15/2019 0308   ALT 16 02/17/2019 0358   ALT 20 02/15/2019 0308   ALKPHOS 97 02/17/2019 0358   ALKPHOS 121 02/15/2019 0308   BILITOT 0.9 02/17/2019 0358   BILITOT 0.5 02/15/2019 0308   PROT 6.3 (L) 02/17/2019 0358   PROT 7.4 02/15/2019 0308   ALBUMIN 3.3 (L) 02/17/2019 0358   ALBUMIN 3.9 02/15/2019 0308    Studies/Results: No results found.    [name] 02/27/2019  Patient ID: Ashley Gentry, female   DOB: Aug 19, 1975, 43 y.o.   MRN: [id number]

## 2019-02-27 NOTE — Progress Notes (Signed)
Patient agitated over no output in colostomy, surgeon told her this am to take things slowly with her diet, for breakfast she ordered, omelet, oatmeal and bacon.  As the day progressed she continued to be anxious requesting several different med changes, referred to MD with orders made and carried out.  Pt again requested for the surgeon to be updated regarding her abdominal cramping and lack of output.  Spoke with Dr. Marlou Starks he stated that his recommendation would be to be NPO and see if ouput occurs,relayed this to the patient and she stated "I'll try"

## 2019-02-27 NOTE — Progress Notes (Signed)
Patient expresses multiple complaints of the care she has received. Patient explains to me that she has not had ostomy output since yesterday and is becoming more distended. She also expresses that she is having to manage her own care and that an abdominal xray is necessary due to her last one being done on the 24th. She expresses the need to take her miralax and that she did not receive her dose today. MD has been made of aware. New orders received.
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
## 2019-02-28 LAB — BASIC METABOLIC PANEL
Anion gap: 9 (ref 5–15)
BUN: <5 mg/dL — ABNORMAL LOW (ref 6–20)
CO2: 27 mmol/L (ref 22–32)
Calcium: 9 mg/dL (ref 8.9–10.3)
Chloride: 104 mmol/L (ref 98–111)
Creatinine, Ser: 0.42 mg/dL — ABNORMAL LOW (ref 0.44–1.00)
GFR calc Af Amer: >60 mL/min (ref >60)
GFR calc non Af Amer: >60 mL/min (ref >60)
Glucose, Bld: 96 mg/dL (ref 70–99)
Potassium: 3.7 mmol/L (ref 3.5–5.1)
Sodium: 140 mmol/L (ref 135–145)

## 2019-02-28 MED ORDER — SCOPOLAMINE 1 MG/3DAYS TD PT72
1.0000 | MEDICATED_PATCH | TRANSDERMAL | Status: DC
  Administered 2019-03-03: 1.5 mg via TRANSDERMAL
  Filled 2019-02-28 (×2): qty 1

## 2019-02-28 MED ORDER — METHYLPHENIDATE HCL ER 36 MG PO TB24
36.0000 mg | ORAL_TABLET | Freq: Every day | ORAL | Status: DC
  Administered 2019-02-28 – 2019-03-05 (×5): 36 mg via ORAL
  Filled 2019-02-28 (×5): qty 1

## 2019-02-28 MED ORDER — ACETAMINOPHEN 500 MG PO TABS: 1000.0000 mg | ORAL_TABLET | Freq: Once | ORAL | Status: AC

## 2019-02-28 MED ORDER — ENSURE ENLIVE PO LIQD
237.0000 mL | Freq: Three times a day (TID) | ORAL | Status: DC
  Administered 2019-02-28 – 2019-03-04 (×4): 237 mL via ORAL

## 2019-02-28 MED ORDER — POLYETHYLENE GLYCOL 3350 17 G PO PACK
17.0000 g | PACK | Freq: Two times a day (BID) | ORAL | Status: DC
  Administered 2019-02-28 (×2): 17 g via ORAL
  Filled 2019-02-28 (×3): qty 1

## 2019-02-28 MED ORDER — ACETAMINOPHEN 325 MG PO TABS
650.0000 mg | ORAL_TABLET | Freq: Four times a day (QID) | ORAL | Status: DC | PRN
  Administered 2019-02-28: 09:00:00 650 mg via ORAL
  Filled 2019-02-28: qty 2

## 2019-02-28 NOTE — Plan of Care (Signed)
  Problem: Education: Goal: Knowledge of General Education information will improve Description: Including pain rating scale, medication(s)/side effects and non-pharmacologic comfort measures 02/28/2019 1729 by Audrea Muscat, RN Outcome: Progressing 02/28/2019 1729 by Audrea Muscat, RN Outcome: Progressing   Problem: Health Behavior/Discharge Planning: Goal: Ability to manage health-related needs will improve 02/28/2019 1729 by Audrea Muscat, RN Outcome: Progressing 02/28/2019 1729 by Audrea Muscat, RN Outcome: Progressing   Problem: Clinical Measurements: Goal: Ability to maintain clinical measurements within normal limits will improve 02/28/2019 1729 by Audrea Muscat, RN Outcome: Progressing 02/28/2019 1729 by Audrea Muscat, RN Outcome: Progressing Goal: Will remain free from infection 02/28/2019 1729 by Audrea Muscat, RN Outcome: Progressing 02/28/2019 1729 by Audrea Muscat, RN Outcome: Progressing Goal: Diagnostic test results will improve 02/28/2019 1729 by Audrea Muscat, RN Outcome: Progressing 02/28/2019 1729 by Audrea Muscat, RN Outcome: Progressing Goal: Respiratory complications will improve 02/28/2019 1729 by Audrea Muscat, RN Outcome: Progressing 02/28/2019 1729 by Audrea Muscat, RN Outcome: Progressing Goal: Cardiovascular complication will be avoided 02/28/2019 1729 by Audrea Muscat, RN Outcome: Progressing 02/28/2019 1729 by Audrea Muscat, RN Outcome: Progressing   Problem: Activity: Goal: Risk for activity intolerance will decrease 02/28/2019 1729 by Audrea Muscat, RN Outcome: Progressing 02/28/2019 1729 by Audrea Muscat, RN Outcome: Progressing   Problem: Nutrition: Goal: Adequate nutrition will be maintained 02/28/2019 1729 by Audrea Muscat, RN Outcome: Progressing 02/28/2019 1729 by Audrea Muscat, RN Outcome: Progressing   Problem: Coping: Goal: Level of anxiety will decrease 02/28/2019  1729 by Audrea Muscat, RN Outcome: Progressing 02/28/2019 1729 by Audrea Muscat, RN Outcome: Progressing   Problem: Elimination: Goal: Will not experience complications related to bowel motility 02/28/2019 1729 by Audrea Muscat, RN Outcome: Progressing 02/28/2019 1729 by Audrea Muscat, RN Outcome: Progressing Goal: Will not experience complications related to urinary retention 02/28/2019 1729 by Audrea Muscat, RN Outcome: Progressing 02/28/2019 1729 by Audrea Muscat, RN Outcome: Progressing   Problem: Pain Managment: Goal: General experience of comfort will improve 02/28/2019 1729 by Audrea Muscat, RN Outcome: Progressing 02/28/2019 1729 by Audrea Muscat, RN Outcome: Progressing   Problem: Safety: Goal: Ability to remain free from injury will improve 02/28/2019 1729 by Audrea Muscat, RN Outcome: Progressing 02/28/2019 1729 by Audrea Muscat, RN Outcome: Progressing   Problem: Skin Integrity: Goal: Risk for impaired skin integrity will decrease 02/28/2019 1729 by Audrea Muscat, RN Outcome: Progressing 02/28/2019 1729 by Audrea Muscat, RN Outcome: Progressing

## 2019-02-28 NOTE — Progress Notes (Signed)
     Assessment & Plan: POD#5 - laparoscopicenterolysis of adhesions, repair of internal hernia 02/22/2019-Dr. Alphonsa Overall - minimal output from ostomy overnight - AXR yesterday without acute findings - encourage po liquids, light diet - encourage OOB to chair, increase activity - resume Miralax - encouraged to limit pain Rx's        [name], MD       [facility]       Office: [phone]   Chief Complaint: Partial SBO, internal hernia  Subjective: Patient in bed, limited output from ostomy.  Some abd pain.  Objective: Vital signs in last 24 hours: Temp:  [97.9 F (36.6 C)-98.1 F (36.7 C)] 97.9 F (36.6 C) (11/28 0625) Pulse Rate:  [75-93] 75 (11/28 0625) Resp:  [15] 15 (11/28 0625) BP: (96-118)/(64-88) 96/64 (11/28 0625) SpO2:  [99 %-100 %] 99 % (11/28 0625) Weight:  [69.1 kg] 69.1 kg (11/28 0625) Last BM Date: 02/26/19  Intake/Output from previous day: 11/27 0701 - 11/28 0700 In: 1349.3 [P.O.:100; I.V.:1249.3] Out: 3150 [Urine:3150] Intake/Output this shift: No intake/output data recorded.  Physical Exam: HEENT - sclerae clear, mucous membranes moist Neck - soft Abdomen - soft, minimal distension; minimal tenderness; active BS present; wounds dry and intact; ostomy bag with minimal output this AM   Lab Results:  No results for input(s): WBC, HGB, HCT, PLT in the last 72 hours. BMET No results for input(s): NA, K, CL, CO2, GLUCOSE, BUN, CREATININE, CALCIUM in the last 72 hours. PT/INR No results for input(s): LABPROT, INR in the last 72 hours. Comprehensive Metabolic Panel:    Component Value Date/Time   NA 135 02/25/2019 0345   NA 135 02/24/2019 0405   K 3.7 02/25/2019 0345   K 3.4 (L) 02/24/2019 0405   CL 102 02/25/2019 0345   CL 100 02/24/2019 0405   CO2 21 (L) 02/25/2019 0345   CO2 21 (L) 02/24/2019 0405   BUN <5 (L) 02/25/2019 0345   BUN <5 (L) 02/24/2019 0405   CREATININE 0.47 02/25/2019 0345   CREATININE 0.37  (L) 02/24/2019 0405   GLUCOSE 77 02/25/2019 0345   GLUCOSE 76 02/24/2019 0405   CALCIUM 8.8 (L) 02/25/2019 0345   CALCIUM 8.9 02/24/2019 0405   AST 16 02/17/2019 0358   AST 19 02/15/2019 0308   ALT 16 02/17/2019 0358   ALT 20 02/15/2019 0308   ALKPHOS 97 02/17/2019 0358   ALKPHOS 121 02/15/2019 0308   BILITOT 0.9 02/17/2019 0358   BILITOT 0.5 02/15/2019 0308   PROT 6.3 (L) 02/17/2019 0358   PROT 7.4 02/15/2019 0308   ALBUMIN 3.3 (L) 02/17/2019 0358   ALBUMIN 3.9 02/15/2019 0308    Studies/Results: Dg Abd Portable 1v  Result Date: 02/27/2019 CLINICAL DATA:  Abdominal distention EXAM: PORTABLE ABDOMEN - 1 VIEW COMPARISON:  02/24/2019 FINDINGS: Decreasing gaseous distention of bowel since prior study. Nonobstructive bowel gas pattern. No organomegaly or free air. No suspicious calcification. Postoperative changes in the thoracolumbar spine. IMPRESSION: No acute findings. Resolution of previously seen dilated small bowel loops. Electronically Signed   By: Rolm Baptise M.D.   On: 02/27/2019 22:24      [name] 02/28/2019  Patient ID: Ashley Gentry, female   DOB: 06/26/1975, 43 y.o.   MRN: [id number]
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
## 2019-03-01 ENCOUNTER — Inpatient Hospital Stay (HOSPITAL_COMMUNITY): Payer: BC Managed Care – PPO

## 2019-03-01 DIAGNOSIS — L8990 Pressure ulcer of unspecified site, unspecified stage: Secondary | ICD-10-CM | POA: Insufficient documentation

## 2019-03-01 NOTE — Progress Notes (Signed)
Spoke w/ physician on call. Abdominal xray completed; discharge held. Patient to be NPO overnight; to be seen by hospitalist and surgeon in the morning for further evaluation. Will continue to monitor.

## 2019-03-01 NOTE — Discharge Summary (Signed)
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 02/25/19 0345  WBC 8.9 8.6 6.9  HGB 11.4* 12.0 12.4  HCT 37.4 39.1 39.9  MCV 93.7 94.0 93.0  PLT 314 328 341   Cardiac Enzymes: No results for input(s): CKTOTAL, CKMB, CKMBINDEX, TROPONINI in the last 168 hours. BNP: Invalid input(s): POCBNP CBG: Recent Labs  Lab 02/22/19 1157  GLUCAP 107*   D-Dimer No results for input(s): DDIMER in the last 72 hours. Hgb A1c No results for input(s): HGBA1C in the last 72 hours. Lipid Profile No results for input(s): CHOL, HDL, LDLCALC, TRIG, CHOLHDL, LDLDIRECT in the last 72 hours. Thyroid function studies No results for input(s): TSH, T4TOTAL, T3FREE, THYROIDAB in the last 72 hours.  Invalid input(s): FREET3 Anemia work up No results for input(s): VITAMINB12, FOLATE, FERRITIN, TIBC, IRON, RETICCTPCT in the last 72 hours. Urinalysis    Component Value Date/Time   COLORURINE YELLOW 02/14/2019 1705   APPEARANCEUR CLOUDY (A) 02/14/2019  1705   LABSPEC 1.010 02/14/2019 1705   PHURINE 7.5 02/14/2019 1705   GLUCOSEU NEGATIVE 02/14/2019 1705   HGBUR NEGATIVE 02/14/2019 1705   BILIRUBINUR NEGATIVE 02/14/2019 1705   KETONESUR NEGATIVE 02/14/2019 1705   PROTEINUR NEGATIVE 02/14/2019 1705   NITRITE NEGATIVE 02/14/2019 1705   LEUKOCYTESUR SMALL (A) 02/14/2019 1705   Sepsis Labs Invalid input(s): PROCALCITONIN,  WBC,  LACTICIDVEN Microbiology Recent Results (from the past 240 hour(s))  Surgical pcr screen     Status: None   Collection Time: 02/22/19  9:37 AM   Specimen: Nasal  Mucosa; Nasal Swab  Result Value Ref Range Status   MRSA, PCR NEGATIVE NEGATIVE Final   Staphylococcus aureus NEGATIVE NEGATIVE Final    Comment: (NOTE) The Xpert SA Assay (FDA approved for NASAL specimens in patients 4 years of age and older), is one component of a comprehensive surveillance program. It is not intended to diagnose infection nor to guide or monitor treatment. Performed at [facility], [place] [address] [place], [place] [address]      Time coordinating discharge: Over 30 minutes  SIGNED:   Darliss Cheney, MD  [facility] 03/01/2019, 10:36 AM  If 7PM-7AM, please contact night-coverage [email or web address] Password [email or web address]

## 2019-03-01 NOTE — Plan of Care (Signed)
Patient lying in bed this morning; states she is hopeful for discharge soon. Pain controlled at this time. No nausea. Will continue to monitor.

## 2019-03-01 NOTE — Progress Notes (Signed)
     Assessment & Plan: POD#6- laparoscopicenterolysis of adhesions, repair of internal hernia 02/22/2019-Dr. Alphonsa Overall - return of bowel/ostomy function overnight - tolerating regular diet with no nausea or emesis, denies abdominal pain - OK for discharge to home from surgical standpoint - will arrange post op visit at [place] office in 2 weeks        [name], MD       [facility]       Office: [phone]   Chief Complaint: Partial SBO  Subjective: Patient improved this AM, having ostomy output, denies abd pain.  No nausea or emesis.  Taking regular diet.  Objective: Vital signs in last 24 hours: Temp:  [97.5 F (36.4 C)-98.4 F (36.9 C)] 97.5 F (36.4 C) (11/29 0521) Pulse Rate:  [93-105] 93 (11/29 0521) Resp:  [16] 16 (11/29 0521) BP: (108)/(68-86) 108/86 (11/29 0521) SpO2:  [91 %-98 %] 91 % (11/29 0521) Last BM Date: 02/28/19  Intake/Output from previous day: 11/28 0701 - 11/29 0700 In: 2359 [P.O.:1080; I.V.:1279] Out: 3600 [Urine:3600] Intake/Output this shift: No intake/output data recorded.  Physical Exam: HEENT - sclerae clear, mucous membranes moist Neck - soft Abdomen - soft without distension; soft output in ostomy bag   Lab Results:  No results for input(s): WBC, HGB, HCT, PLT in the last 72 hours. BMET Recent Labs    02/28/19 0807  NA 140  K 3.7  CL 104  CO2 27  GLUCOSE 96  BUN <5*  CREATININE 0.42*  CALCIUM 9.0   PT/INR No results for input(s): LABPROT, INR in the last 72 hours. Comprehensive Metabolic Panel:    Component Value Date/Time   NA 140 02/28/2019 0807   NA 135 02/25/2019 0345   K 3.7 02/28/2019 0807   K 3.7 02/25/2019 0345   CL 104 02/28/2019 0807   CL 102 02/25/2019 0345   CO2 27 02/28/2019 0807   CO2 21 (L) 02/25/2019 0345   BUN <5 (L) 02/28/2019 0807   BUN <5 (L) 02/25/2019 0345   CREATININE 0.42 (L) 02/28/2019 0807   CREATININE 0.47 02/25/2019 0345   GLUCOSE 96 02/28/2019 0807   GLUCOSE 77 02/25/2019 0345   CALCIUM 9.0 02/28/2019 0807   CALCIUM 8.8 (L) 02/25/2019 0345   AST 16 02/17/2019 0358   AST 19 02/15/2019 0308   ALT 16 02/17/2019 0358   ALT 20 02/15/2019 0308   ALKPHOS 97 02/17/2019 0358   ALKPHOS 121 02/15/2019 0308   BILITOT 0.9 02/17/2019 0358   BILITOT 0.5 02/15/2019 0308   PROT 6.3 (L) 02/17/2019 0358   PROT 7.4 02/15/2019 0308   ALBUMIN 3.3 (L) 02/17/2019 0358   ALBUMIN 3.9 02/15/2019 0308    Studies/Results: Dg Abd Portable 1v  Result Date: 02/27/2019 CLINICAL DATA:  Abdominal distention EXAM: PORTABLE ABDOMEN - 1 VIEW COMPARISON:  02/24/2019 FINDINGS: Decreasing gaseous distention of bowel since prior study. Nonobstructive bowel gas pattern. No organomegaly or free air. No suspicious calcification. Postoperative changes in the thoracolumbar spine. IMPRESSION: No acute findings. Resolution of previously seen dilated small bowel loops. Electronically Signed   By: Rolm Baptise M.D.   On: 02/27/2019 22:24      [name] 03/01/2019  Patient ID: Ashley Gentry, female   DOB: 1975-07-22, 43 y.o.   MRN: [id number]

## 2019-03-01 NOTE — Discharge Instructions (Addendum)
Patient Experience- [id number] Please call to review/share your experience with care during hospitalization  Home Health- [facility] [phone]- will be calling you to schedule first visit   [facility]  LAPAROSCOPIC SURGERY:  POST-OP INSTRUCTIONS  Always review your discharge instruction sheet given to you by the facility where your surgery was performed.  A prescription for pain medication may be given to you upon discharge.  Take your pain medication as prescribed.  If narcotic pain medicine is not needed, then you may take acetaminophen (Tylenol) or ibuprofen (Advil) as needed.  Take your usually prescribed medications unless otherwise directed.  If you need a refill on your pain medication, please contact your pharmacy.  They will contact our office to request authorization. Prescriptions will not be filled after 5 P.M. or on weekends.  You should follow a light diet the first few days after arrival home, such as soup and crackers or toast.  Be sure to include plenty of fluids daily.  Most patients will experience some swelling and bruising in the area of the incisions.  Ice packs will help.  Swelling and bruising can take several days to resolve.   It is common to experience some constipation after surgery.  Increasing fluid intake and taking a stool softener (such as Colace) will usually help or prevent this problem from occurring.  A mild laxative (Milk of Magnesia or Miralax) should be taken according to package instructions if there has been no bowel movement after 48 hours.  You will likely have Dermabond (topical glue) over your incisions.  This seals the incisions and allows you to bathe and shower at any time after your surgery.  Glue should remain in place for up to 10 days.  It may be removed after 10 days by pealing off the Dermabond material or using Vaseline or naval jelly to remove.  If you have steri-strips over your incisions, you may  remove the gauze bandage on the second day after surgery, and you may shower at that time.  Leave your steri-strips (small skin tapes) in place directly over the incision.  These strips should remain on the skin for 5-7 days and then be removed.  You may get them wet in the shower and pat them dry.  Any sutures or staples will be removed at the office during your follow-up visit.  ACTIVITIES:  You may resume regular (light) daily activities beginning the next day - such as daily self-care, walking, climbing stairs - gradually increasing activities as tolerated.  You may have sexual intercourse when it is comfortable.  Refrain from any heavy lifting or straining until approved by your doctor.  You may drive when you are no longer taking prescription pain medication, when you can comfortably wear a seatbelt, and when you can safely maneuver your car and apply brakes.  You should see your doctor in the office for a follow-up appointment approximately 2-3 weeks after your surgery.  Make sure that you call for this appointment within a day or two after you arrive home to insure a convenient appointment time.  WHEN TO CALL YOUR DOCTOR: 1. Fever over 101.0 2. Inability to urinate 3. Continued bleeding from incision 4. Increased pain, redness, or drainage from the incision 5. Increasing abdominal pain  The clinic staff is available to answer your questions during regular business hours.  Please dont hesitate to call and ask to speak to one of the nurses for clinical concerns.  If you have a medical emergency,  go to the nearest emergency room or call 911.  A surgeon from [facility] is always on call for the hospital.  [facility] Office: [place] Free:  [place] [phone]  Website: [email or web address]

## 2019-03-01 NOTE — TOC Progression Note (Signed)
Transition of Care [facility]) - Progression Note    Patient Details  Name: Ashley Gentry MRN: [id number] Date of Birth: Feb 16, 1976  [facility] CM/SW Contact  Joaquin Courts, RN Phone Number: 03/01/2019, 4:56 PM  Clinical Narrative:    CM spoke with patient regarding transport home, patient with questions about insurance coverage of PTAR transport. CM explained that insurance will cover ambulance transport if deemed medically necessary but CM could not provide guarantee of payment. Patient expressed that she will have her dad pick her up from the hospital. Additionally, CM [facility], which patient and CSW, Meaghan,  have previously spoken with regarding [place] services.  Patient also requesting medical records release form, which CM obtained from another floor and it will be sent via tube station to nurses desk. Patient also requesting her home medications which were stored in pharmacy, bedside RN was notified of this.    Expected Discharge Plan: [place] Barriers to Discharge: Barriers Resolved  Expected Discharge Plan and Services Expected Discharge Plan: [place] arrangements for the past 2 months: Single Family Home Expected Discharge Date: 03/01/19                         HH Arranged: RN, Nurse's Aide HH Agency: ([employer] [facility]) Date [place]: 02/25/19 Time [place]: 1122 Representative spoke with at [place]: [place] ([place]) Interventions    Readmission Risk Interventions No flowsheet data found.

## 2019-03-02 MED ORDER — POLYETHYLENE GLYCOL 3350 17 G PO PACK
17.0000 g | PACK | Freq: Every day | ORAL | Status: DC
  Filled 2019-03-02: qty 1

## 2019-03-02 NOTE — Consult Note (Addendum)
[place] Nurse Consult Note: Reason for Consult: pressure injury Wound type: Deep tissue pressure injury Pressure Injury POA: No Measurement: 5cm x 6.5cm x 0cm  Wound HYI:FOYD purple, non blanchable tissue; superficial skin loss along the proximal edge of the wound Drainage (amount, consistency, odor) none Periwound: intact  Dressing procedure/placement/frequency: Continue silicone foam; lift to inspect each shift for evolution Add air mattress for pressure redistribution Request PT to evaluate for roho cushion. Unclear on DME in the home. Patient has lots of comments that are kinda all over the place.  Discussed with hospitalist, need for [facility] to monitor sacral wound for evolution.  Needs better set up at home with DME and assistance.     [place] Nurse ostomy consult note Stoma type/location: LLQ, colostomy Created at [facility] in Aug. 2020. Limited education per patient; however patient was in rehab facilities; She was "managing at home" but is out of supplies.  Stomal assessment/size: 1 1/2" round, os at 12 o'clock, budded, pink and moist  Peristomal assessment: intact  Treatment options for stomal/peristomal skin: 2" barrier ring  Output liquid with some solid light brown stool Ostomy pouching: 1pc. Flat with 2" barrier ring. Patient using 2pc closed in [employer] at home. Was using samples up until now. I have placed 2 flat one pc and 2 2 inch barrier rings in the patient's room  Education provided:  Reminded patient about contacting [employer] (her idea) states she has a rep she can call for the item numbers she will need when she gets home. Has medical supplier established for other DME needs at home.  Enrolled patient in [employer] program: No; patient prefers [place], [place], [place]
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
## 2019-03-02 NOTE — TOC Progression Note (Signed)
Transition of Care [facility]) - Progression Note    Patient Details  Name: Ashley Gentry MRN: [id number] Date of Birth: [birth year]  Transition of Care [facility]) CM/SW [place], [place] Phone Number: 03/02/2019, 4:43 PM  Clinical Narrative:    Evaluation was completed by [facility] nurse, hospital bed w/ air mattress and cushion for wheelchair recommended. CSW met with the patient at bedside to determine if additional DME is needed at home. Physical Therapist and RN also at bedside at time. Patient requested to see a picture of the hospital bed. CSW provided patient with a picture and explain the rental/purchase process. Patient declines the hospital bed. Patient reports she has a bed that can move up and down and feels she will continue to manage with it. Patient agreeable to wheelchair cushion. Patient express she would prefer to go to another facility at discharge for continued treatment. Patient went into detail about [facility] and reaching out to them in hopes to be accepted.   Expected Discharge Plan: [place] Barriers to Discharge: Continued Medical Work up  Expected Discharge Plan and Services Expected Discharge Plan: [place] arrangements for the past 2 months: Single Family Home Expected Discharge Date: 03/01/19                         HH Arranged: RN, Nurse's Aide HH Agency: ([facility]) Date [place]: 02/25/19 Time [place]: 1122 Representative spoke with at [place]: [place] ([place]) Interventions    Readmission Risk Interventions No flowsheet data found.

## 2019-03-02 NOTE — Progress Notes (Addendum)
Patient ID: Ashley Gentry, female   DOB: 07/24/1975, 43 y.o.   MRN: [id number]    8 Days Post-Op  Subjective:  Unfortunately, patient went to be discharged yesterday after tolerating a solid diet and upon transferring to her chair and sitting up, she immediately got very nauseated.  She never had emesis.  Her ostomy continues to work with a lot of flatus and some stool.  She feels it is not as much stool as she normally has.  She also normally takes Miralax BID at home.  She had a plain film done due to her nausea yesterday which reveals some gastric distention along with some dilated loops of small bowel and air in her colon.   She states she feels better some today.  Her nausea is somewhat better today.  She doesn't really have any abdominal pain.   ROS: See above, otherwise other systems negative  Objective: Vital signs in last 24 hours: Temp:  [97.4 F (36.3 C)-98.4 F (36.9 C)] 97.9 F (36.6 C) (11/30 1428) Pulse Rate:  [90-110] 94 (11/30 1428) Resp:  [17-19] 18 (11/30 1428) BP: (100-113)/(53-94) 109/94 (11/30 1428) SpO2:  [91 %-100 %] 99 % (11/30 1428) Weight:  [70.1 kg] 70.1 kg (11/30 0603) Last BM Date: 03/02/19  Intake/Output from previous day: 11/29 0701 - 11/30 0700 In: 1756 [P.O.:240; I.V.:1516] Out: 3000 [Urine:3000] Intake/Output this shift: Total I/O In: -  Out: 900 [Urine:900]  PE: Heart: regular Lungs: CTAB Abd: soft, some distention, but nontender, incisions are c/d/i and healing well.  Colostomy pouch just changed, but already has some stool coming out of os and flatus.  Great BS.  SP tube in place with some clear yellow urine.  Lab Results:  No results for input(s): WBC, HGB, HCT, PLT in the last 72 hours. BMET Recent Labs    02/28/19 0807  NA 140  K 3.7  CL 104  CO2 27  GLUCOSE 96  BUN <5*  CREATININE 0.42*  CALCIUM 9.0   PT/INR No results for input(s): LABPROT, INR in the last 72 hours. CMP     Component Value Date/Time   NA 140 02/28/2019  0807   K 3.7 02/28/2019 0807   CL 104 02/28/2019 0807   CO2 27 02/28/2019 0807   GLUCOSE 96 02/28/2019 0807   BUN <5 (L) 02/28/2019 0807   CREATININE 0.42 (L) 02/28/2019 0807   CALCIUM 9.0 02/28/2019 0807   PROT 6.3 (L) 02/17/2019 0358   ALBUMIN 3.3 (L) 02/17/2019 0358   AST 16 02/17/2019 0358   ALT 16 02/17/2019 0358   ALKPHOS 97 02/17/2019 0358   BILITOT 0.9 02/17/2019 0358   GFRNONAA >60 02/28/2019 0807   GFRAA >60 02/28/2019 0807   Lipase     Component Value Date/Time   LIPASE 26 02/14/2019 1705       Studies/Results: Dg Abd 1 View  Result Date: 03/01/2019 CLINICAL DATA:  Patient with nausea and abdominal bloating. History of small-bowel obstruction. EXAM: ABDOMEN - 1 VIEW COMPARISON:  Abdominal radiograph 02/27/2019 FINDINGS: Interval increase in gaseous distended loops of small bowel within the central abdomen. Small amount of gas within the colon. The stomach is gaseous distended. Lung bases are clear. Thoracic spinal fusion hardware. IMPRESSION: Increased gaseous distended loops of small bowel within the central abdomen most compatible with early/partial small bowel obstruction. Small amount of distal colonic gas is present. Electronically Signed   By: Annia Belt M.D.   On: 03/01/2019 19:49    Anti-infectives: Anti-infectives (From admission, onward)  Start     Dose/Rate Route Frequency Ordered Stop   02/22/19 0922  sodium chloride 0.9 % with cefoTEtan (CEFOTAN) ADS Med    Note to Pharmacy: Darlys Gales   : cabinet override      02/22/19 0922 02/22/19 2129   02/16/19 1100  ciprofloxacin (CIPRO) IVPB 400 mg  Status:  Discontinued     400 mg 200 mL/hr over 60 Minutes Intravenous Every 12 hours 02/16/19 0919 02/16/19 1112   02/15/19 1000  erythromycin (E-MYCIN) tablet 250 mg  Status:  Discontinued    Note to Pharmacy: 1-2 daily     250 mg Oral Daily 02/15/19 0319 02/15/19 1528   02/15/19 0800  ciprofloxacin (CIPRO) tablet 500 mg  Status:  Discontinued     500  mg Oral 2 times daily 02/15/19 0319 02/16/19 0919       Assessment/Plan S/P robotic in colostomy 12/2018 Dr. Lyndel Pleasure colorectal surgery for chronic constipation/stimulation issues. Anxiety/depression/bipolar disease Hx DVT Hx nephrolithiasis/hydronephrosis Neurogenic bladder with chronic suprapubic catheter Hyponatremia/hypochloremia- improved Hypokalemia - 3.7 2 days ago, will recheck in am   POD 8, s/p Diagnostic laparoscopy, enterolysis of adhesions, repair of internal hernia 02/22/2019 Dr. Alphonsa Overall secondary to bowel obstruction  patient's films reviewed with some gastric distention and some small bowel dilatation.  She does have air in her colon as well.  She is currently still passing flatus and some stool in her colostomy suggesting this isn't secondary to a recurrent obstruction.  It is more likely to be secondary to neurogenic bowel with compounding ileus.    She does feel better today.  Will allows sips of clears from the floor for comfort.  She also is very particular about her medications and normally takes miralax BID at home to help with bowel regularity.  I think because she is passing stuff in her ostomy, it is safe to at least give her one dose of miralax a day to help with motility.  Mobilization is key as well as much as possible.  She needs to be out of bed and up in a chair as much as possible.  The patient is also having some frustrations with staffing from physicians to techs.  She would like to floors and I think that is reasonable.  I have discussed this with the primary service who agrees with this as well.  We will continue to follow.  Check BMET in am to see what her electrolytes look like to make sure she doesn't need K or something else replaced.  FEN:IV fluids/sips of clears of from floor ID: None DVT: Lovenox Follow-up:Dr. Zenovia Justman/Dr. Robinson([facility])   LOS: 16 days    Henreitta Cea , [facility] Surgery 03/02/2019, 3:44 PM  Please see Amion for pager number during day hours 7:00am-4:30pm  Agree with above. Her colostomy is working, she has not been out of bed in several days, her abdominal exam is unremarkable. She was taking Miralax at home. There was an attempt to move her to another floor because of personality issues, but that is not going to happen by someone high in the [facility] system.  Alphonsa Overall, MD, [facility] Surgery Office phone:  [phone]
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
## 2019-03-02 NOTE — Progress Notes (Signed)
Patient express her concerns regarding her medical doctor. She verbalizes " I am not comfortable talking to him and I want to have a different doctor" . In addition to that patient felt a knot located to her left lower abdomen area was palpated by the RN and a lump was felt. We will continue to monitor.

## 2019-03-03 LAB — BASIC METABOLIC PANEL
Anion gap: 10 (ref 5–15)
BUN: 5 mg/dL — ABNORMAL LOW (ref 6–20)
CO2: 25 mmol/L (ref 22–32)
Calcium: 8.6 mg/dL — ABNORMAL LOW (ref 8.9–10.3)
Chloride: 107 mmol/L (ref 98–111)
Creatinine, Ser: 0.48 mg/dL (ref 0.44–1.00)
GFR calc Af Amer: >60 mL/min (ref >60)
GFR calc non Af Amer: >60 mL/min (ref >60)
Glucose, Bld: 114 mg/dL — ABNORMAL HIGH (ref 70–99)
Potassium: 3.3 mmol/L — ABNORMAL LOW (ref 3.5–5.1)
Sodium: 142 mmol/L (ref 135–145)

## 2019-03-03 MED ORDER — POLYETHYLENE GLYCOL 3350 17 G PO PACK
17.0000 g | PACK | Freq: Two times a day (BID) | ORAL | Status: DC
  Administered 2019-03-03 – 2019-03-05 (×4): 17 g via ORAL
  Filled 2019-03-03 (×4): qty 1

## 2019-03-03 MED ORDER — POTASSIUM CHLORIDE CRYS ER 20 MEQ PO TBCR
40.0000 meq | EXTENDED_RELEASE_TABLET | ORAL | Status: AC
  Administered 2019-03-03 (×2): 40 meq via ORAL
  Filled 2019-03-03 (×2): qty 2

## 2019-03-03 MED ORDER — PANTOPRAZOLE SODIUM 40 MG PO TBEC
40.0000 mg | DELAYED_RELEASE_TABLET | Freq: Every day | ORAL | Status: DC
  Administered 2019-03-03 – 2019-03-05 (×3): 40 mg via ORAL
  Filled 2019-03-03 (×2): qty 1
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
## 2019-03-03 NOTE — Progress Notes (Addendum)
[facility]/Trauma Progress Note  9 Days Post-Op   Assessment/Plan S/P robotic in colostomy 12/2018 Dr. Jori Moll colorectal surgery for chronic constipation/stimulation issues. Anxiety/depression/bipolar disease Hx DVT Hx nephrolithiasis/hydronephrosis Neurogenic bladder with chronic suprapubic catheter Hyponatremia/hypochloremia- improved Hypokalemia - 3.7 2 days ago, will recheck in am  POD 9, s/p Diagnostic laparoscopy, enterolysis of adhesions, repair of internal hernia 02/22/2019 Dr. Ovidio Kin secondary to bowel obstruction - films reviewed (11/29) with some gastric distention, small bowel dilatation and colonic gas. Having flatus and stool suggesting this isn't 2/2 a recurrent obstruction. More likely to be 2/2 neurogenic bowel with compounding ileus.   - encourage OOB - having stool and gas in ostomy - overall feeling better and has an appetite. No nausea or vomiting overnight.  - advance to FLD - miralax BID   FEN: FLD ID: None DVT: Lovenox Follow-up:Dr. Shantina Chronister/Dr. Robinson([facility])    LOS: 17 days    Subjective: CC: mild abdominal pain  Pt is very uncomfortable in bed today. She thinks she slept wrong. She feels the pad under her is making her uncomfortable. She denies nausea or vomiting. She is hungry. She feels a lesion under her skin on her left flank. Not painful. Just concerned about it. I showed her the CT scan and that I did not see anything concerning in her L flank. I also showed her the AXR's and her bowel dilatation. She was thankful that I shared the images with her. She is comfortable with advancing to a full liquid diet.    Objective: Vital signs in last 24 hours: Temp:  [97.9 F (36.6 C)-98.3 F (36.8 C)] 98.2 F (36.8 C) (12/01 0635) Pulse Rate:  [81-94] 81 (12/01 0635) Resp:  [18] 18 (12/01 0635) BP: (100-126)/(60-94) 100/60 (12/01 0635) SpO2:  [99 %-100 %] 100 % (12/01 0635) Last BM Date: 03/03/19  Intake/Output  from previous day: 11/30 0701 - 12/01 0700 In: 1701.8 [P.O.:220; I.V.:1481.8] Out: 3125 [Urine:3125] Intake/Output this shift: Total I/O In: -  Out: 800 [Urine:400; Stool:400]  PE:  Gen:  Alert, NAD, pleasant, cooperative Pulm:  Rate and effort normal Abd: Soft, not distended, stoma is pink, stool and gas in bag, incisions are well healing and are without signs of infection. Mild generalized TTP worse in the upper abdomen without guarding. No peritonitis.  Skin: no rashes noted, warm and dry   Anti-infectives: Anti-infectives (From admission, onward)   Start     Dose/Rate Route Frequency Ordered Stop   02/22/19 0922  sodium chloride 0.9 % with cefoTEtan (CEFOTAN) ADS Med    Note to Pharmacy: Lyda Kalata   : cabinet override      02/22/19 0922 02/22/19 2129   02/16/19 1100  ciprofloxacin (CIPRO) IVPB 400 mg  Status:  Discontinued     400 mg 200 mL/hr over 60 Minutes Intravenous Every 12 hours 02/16/19 0919 02/16/19 1112   02/15/19 1000  erythromycin (E-MYCIN) tablet 250 mg  Status:  Discontinued    Note to Pharmacy: 1-2 daily     250 mg Oral Daily 02/15/19 0319 02/15/19 1528   02/15/19 0800  ciprofloxacin (CIPRO) tablet 500 mg  Status:  Discontinued     500 mg Oral 2 times daily 02/15/19 0319 02/16/19 0919      Lab Results:  No results for input(s): WBC, HGB, HCT, PLT in the last 72 hours. BMET Recent Labs    03/03/19 0312  NA 142  K 3.3*  CL 107  CO2 25  GLUCOSE 114*  BUN 5*  CREATININE 0.48  CALCIUM 8.6*   PT/INR No results for input(s): LABPROT, INR in the last 72 hours. CMP     Component Value Date/Time   NA 142 03/03/2019 0312   K 3.3 (L) 03/03/2019 0312   CL 107 03/03/2019 0312   CO2 25 03/03/2019 0312   GLUCOSE 114 (H) 03/03/2019 0312   BUN 5 (L) 03/03/2019 0312   CREATININE 0.48 03/03/2019 0312   CALCIUM 8.6 (L) 03/03/2019 0312   PROT 6.3 (L) 02/17/2019 0358   ALBUMIN 3.3 (L) 02/17/2019 0358   AST 16 02/17/2019 0358   ALT 16 02/17/2019 0358    ALKPHOS 97 02/17/2019 0358   BILITOT 0.9 02/17/2019 0358   GFRNONAA >60 03/03/2019 0312   GFRAA >60 03/03/2019 0312   Lipase     Component Value Date/Time   LIPASE 26 02/14/2019 1705    Studies/Results: Dg Abd 1 View  Result Date: 03/01/2019 CLINICAL DATA:  Patient with nausea and abdominal bloating. History of small-bowel obstruction. EXAM: ABDOMEN - 1 VIEW COMPARISON:  Abdominal radiograph 02/27/2019 FINDINGS: Interval increase in gaseous distended loops of small bowel within the central abdomen. Small amount of gas within the colon. The stomach is gaseous distended. Lung bases are clear. Thoracic spinal fusion hardware. IMPRESSION: Increased gaseous distended loops of small bowel within the central abdomen most compatible with early/partial small bowel obstruction. Small amount of distal colonic gas is present. Electronically Signed   By: Lovey Newcomer M.D.   On: 03/01/2019 19:49     Kalman Drape, [facility] Surgery Please see amion for pager for the following: M, T, W, & Friday 7:00am - 4:30pm Thursdays 7:00am -11:30am  Agree with above. Continue to advance diet. She pointed out a knot in her left flank.  It is not impressive and there is nothing on CT scan.  Alphonsa Overall, MD, [facility] Surgery Office phone:  [phone]

## 2019-03-03 NOTE — Progress Notes (Signed)
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
## 2019-03-04 LAB — BASIC METABOLIC PANEL
Anion gap: 9 (ref 5–15)
Anion gap: 10 (ref 5–15)
BUN: 5 mg/dL — ABNORMAL LOW (ref 6–20)
BUN: 6 mg/dL (ref 6–20)
CO2: 24 mmol/L (ref 22–32)
CO2: 25 mmol/L (ref 22–32)
Calcium: 9.1 mg/dL (ref 8.9–10.3)
Calcium: 8.9 mg/dL (ref 8.9–10.3)
Chloride: 106 mmol/L (ref 98–111)
Chloride: 108 mmol/L (ref 98–111)
Creatinine, Ser: 0.38 mg/dL — ABNORMAL LOW (ref 0.44–1.00)
Creatinine, Ser: 0.48 mg/dL (ref 0.44–1.00)
GFR calc Af Amer: >60 mL/min (ref >60)
GFR calc Af Amer: >60 mL/min (ref >60)
GFR calc non Af Amer: >60 mL/min (ref >60)
GFR calc non Af Amer: >60 mL/min (ref >60)
Glucose, Bld: 104 mg/dL — ABNORMAL HIGH (ref 70–99)
Glucose, Bld: 100 mg/dL — ABNORMAL HIGH (ref 70–99)
Potassium: 3.8 mmol/L (ref 3.5–5.1)
Potassium: 3.8 mmol/L (ref 3.5–5.1)
Sodium: 139 mmol/L (ref 135–145)
Sodium: 143 mmol/L (ref 135–145)

## 2019-03-04 LAB — CBC
HCT: 38.2 % (ref 36.0–46.0)
Hemoglobin: 11.7 g/dL — ABNORMAL LOW (ref 12.0–15.0)
MCH: 29.3 pg (ref 26.0–34.0)
MCHC: 30.6 g/dL (ref 30.0–36.0)
MCV: 95.5 fL (ref 80.0–100.0)
Platelets: 297 10*3/uL (ref 150–400)
RBC: 4 MIL/uL (ref 3.87–5.11)
RDW: 15.6 % — ABNORMAL HIGH (ref 11.5–15.5)
WBC: 6 10*3/uL (ref 4.0–10.5)
nRBC: 0 % (ref 0.0–0.2)

## 2019-03-04 LAB — LITHIUM LEVEL: Lithium Lvl: 0.22 mmol/L — ABNORMAL LOW (ref 0.60–1.20)

## 2019-03-04 LAB — AMMONIA: Ammonia: 10 umol/L (ref 9–35)

## 2019-03-04 MED ORDER — NALOXONE HCL 0.4 MG/ML IJ SOLN: 0.4000 mg | INTRAMUSCULAR | Status: DC | PRN

## 2019-03-04 MED ORDER — SODIUM CHLORIDE 0.9 % IV BOLUS
500.0000 mL | Freq: Once | INTRAVENOUS | Status: AC
  Administered 2019-03-04: 500 mL via INTRAVENOUS

## 2019-03-04 MED ORDER — NALOXONE HCL 0.4 MG/ML IJ SOLN
INTRAMUSCULAR | Status: AC
  Administered 2019-03-04: 0.4 mg
  Filled 2019-03-04: qty 1

## 2019-03-04 NOTE — TOC Progression Note (Signed)
Transition of Care [facility]) - Progression Note    Patient Details  Name: Ashley Gentry MRN: [id number] Date of Birth: 1975/11/16  Transition [facility]) CM/SW [place], LCSW Phone Number  03/04/2019, 11:02 AM  Clinical Narrative:   Attempted to meet with pt twice this morning to continue discussing her dc plans and to update her on attempts to gain info about [facility] rehab yesterday per her request (see writer's previous note). Pt very drowsy, speaking lightly/incoherently, unable to remain awake for conversations. Will follow up when more alert.    Expected Discharge Plan: [employer] vs SNF Barriers to Discharge: Continued Medical Work up  Expected Discharge Plan and Services Expected Discharge Plan: [place] arrangements for the past 2 months: Single Family Home Expected Discharge Date: 03/01/19                         HH Arranged: RN, Nurse's Aide HH Agency: ([place] [facility]) Date [place]: 02/25/19 Time [place]: 1122 Representative spoke with at [place]: [place] ([place]) Interventions    Readmission Risk Interventions No flowsheet data found.
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
## 2019-03-04 NOTE — Progress Notes (Signed)
Patient is sitting straight up in her bed. Patient alert and oriented times four. Teaching laboratory technician went in patient's room. Patient had meds from home. Meds taken out of patient's room. Dr. Wynelle Cleveland called and notified. Dr. Wynelle Cleveland came up and saw the patient. Writer took meds to Pharmacy to be identified per Dr. Wynelle Cleveland
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
## 2019-03-04 NOTE — Progress Notes (Addendum)
[facility]/Trauma Progress Note  10 Days Post-Op   Assessment/Plan S/P robotic in colostomy 12/2018 Dr. Lyndel Pleasure colorectal surgery for chronic constipation/stimulation issues. Anxiety/depression/bipolar disease Hx DVT Hx nephrolithiasis/hydronephrosis Neurogenic bladder with chronic suprapubic catheter Hyponatremia/hypochloremia- improved Hypokalemia -3.7 2 days ago, will recheck in am  POD 9, s/pDiagnostic laparoscopy, enterolysis of adhesions, repair of internal hernia 02/22/2019 Dr. Marcelo Baldy to bowel obstruction - films reviewed (11/29) with some gastric distention, small bowel dilatation and colonic gas. Having flatus and stool suggesting this isn't 2/2 a recurrent obstruction. More likely to be 2/2 neurogenic bowel with compounding ileus.  - encourage OOB - having stool and gas in ostomy - No nausea or vomiting overnight.  - advance to soft diet.  - miralax BID   FEN: soft diet, BID miralax ID: None DVT: Lovenox Follow-up:Dr. Sayre Mazor/Dr. Robinson([facility])  DISPO: advance to soft diet today.   If she tolerates she is okay for discharge from our standpoint.     LOS: 18 days    Subjective: CC: no complaints   Pt states she just woke up. She denies abdominal pain, nausea, vomiting, or issues overnight. She tolerated FLD yesterday.   Objective: Vital signs in last 24 hours: Temp:  [98.2 F (36.8 C)-98.4 F (36.9 C)] 98.4 F (36.9 C) (12/02 0617) Pulse Rate:  [79-94] 79 (12/02 0617) Resp:  [15-18] 15 (12/02 0617) BP: (99-125)/(60-89) 99/60 (12/02 0617) SpO2:  [96 %-98 %] 98 % (12/02 0617) Weight:  [70.7 kg] 70.7 kg (12/02 0617) Last BM Date: 03/04/19  Intake/Output from previous day: 12/01 0701 - 12/02 0700 In: 1412.6 [P.O.:600; I.V.:812.6] Out: 2925 [Urine:2525; Stool:400] Intake/Output this shift: No intake/output data recorded.  PE:  Gen:  Alert, NAD, pleasant, cooperative Pulm:  Rate and effort normal Abd:  Soft, not distended, stoma is pink, copious gas in bag, incisions are well healing and are without signs of infection. No TTP. No peritonitis.  Skin: no rashes noted, warm and dry   Anti-infectives: Anti-infectives (From admission, onward)   Start     Dose/Rate Route Frequency Ordered Stop   02/22/19 0922  sodium chloride 0.9 % with cefoTEtan (CEFOTAN) ADS Med    Note to Pharmacy: Darlys Gales   : cabinet override      02/22/19 0922 02/22/19 2129   02/16/19 1100  ciprofloxacin (CIPRO) IVPB 400 mg  Status:  Discontinued     400 mg 200 mL/hr over 60 Minutes Intravenous Every 12 hours 02/16/19 0919 02/16/19 1112   02/15/19 1000  erythromycin (E-MYCIN) tablet 250 mg  Status:  Discontinued    Note to Pharmacy: 1-2 daily     250 mg Oral Daily 02/15/19 0319 02/15/19 1528   02/15/19 0800  ciprofloxacin (CIPRO) tablet 500 mg  Status:  Discontinued     500 mg Oral 2 times daily 02/15/19 0319 02/16/19 0919      Lab Results:  No results for input(s): WBC, HGB, HCT, PLT in the last 72 hours. BMET Recent Labs    03/03/19 0312 03/04/19 0255  NA 142 139  K 3.3* 3.8  CL 107 106  CO2 25 24  GLUCOSE 114* 104*  BUN 5* 5*  CREATININE 0.48 0.38*  CALCIUM 8.6* 9.1   PT/INR No results for input(s): LABPROT, INR in the last 72 hours. CMP     Component Value Date/Time   NA 139 03/04/2019 0255   K 3.8 03/04/2019 0255   CL 106 03/04/2019 0255   CO2 24 03/04/2019 0255   GLUCOSE 104 (H)  03/04/2019 0255   BUN 5 (L) 03/04/2019 0255   CREATININE 0.38 (L) 03/04/2019 0255   CALCIUM 9.1 03/04/2019 0255   PROT 6.3 (L) 02/17/2019 0358   ALBUMIN 3.3 (L) 02/17/2019 0358   AST 16 02/17/2019 0358   ALT 16 02/17/2019 0358   ALKPHOS 97 02/17/2019 0358   BILITOT 0.9 02/17/2019 0358   GFRNONAA >60 03/04/2019 0255   GFRAA >60 03/04/2019 0255   Lipase     Component Value Date/Time   LIPASE 26 02/14/2019 1705    Studies/Results: No results found.  Jerre Simon, PA-C [facility]  Surgery Please see amion for pager for the following: M, T, W, & Friday 7:00am - 4:30pm Thursdays 7:00am -11:30am  Agree with above. She was found somnolent - was going to be transferred to the ICU, but responded to Narcan.  She was found to have a lot of meds - unsure what she took.  But from a general surgery standpoint - she is doing okay.  Ovidio Kin, MD, [facility] Surgery Office phone:  [phone]

## 2019-03-04 NOTE — Progress Notes (Signed)
Patient hard to arouse. Speech is hard to understand. B/P 85/60. Dr. Wynelle Cleveland notified. MD came up and saw patient new orders. Step down bed requested

## 2019-03-04 NOTE — Progress Notes (Signed)
Paged Levell July PA from [place] per patient's request regarding her diet. PA was in OR. Dr. Lucia Gaskins for patient to continue on a Soft diet

## 2019-03-04 NOTE — Progress Notes (Signed)
Call made to RN to assess PIV need. Per RN, PIV was found pulled out, and pt has no current access.

## 2019-03-05 ENCOUNTER — Inpatient Hospital Stay (HOSPITAL_COMMUNITY): Payer: BC Managed Care – PPO

## 2019-03-05 DIAGNOSIS — Z0189 Encounter for other specified special examinations: Secondary | ICD-10-CM

## 2019-03-05 DIAGNOSIS — R140 Abdominal distension (gaseous): Secondary | ICD-10-CM

## 2019-03-05 DIAGNOSIS — R109 Unspecified abdominal pain: Secondary | ICD-10-CM

## 2019-03-05 LAB — LAMOTRIGINE LEVEL: Lamotrigine Lvl: 8.2 ug/mL (ref 2.0–20.0)

## 2019-03-05 NOTE — Progress Notes (Signed)
Discharge instructions were given to patient and her father, they verbalized understanding and questions were answered. Home medication from pharmacy was given back to patient.

## 2019-03-05 NOTE — TOC Transition Note (Signed)
Transition of Care [facility] - CM/SW Discharge Note   Patient Details  Name: Ashley Gentry MRN: [id number] Date of Birth: 08-29-1975  Transition of Care [facility]) CM/SW Contact:  Nila Nephew, LCSW Phone Number: 03/05/2019, 4:05 PM   Clinical Narrative:   Pt discharging today with home health with [place] Staff arranged- updated representative at [phone] that pt is discharging today- agency has pt's information for contacting her to schedule first visit (representative Brayton Layman informs that pt had been scheduled for intake appointment on day of admission to hospital which had to be cancelled, therefore initial visit will be rescheduled in order to initiate [facility] services). Home Health orders placed.   Pt requested portable hoyer or other type of lift ot assist with transfer into vehicle- CSW learned from DME agency that typical hoyer lifts designed to be reasonably portable, no specific portable equipment needed.  Pt expressed feeling upset re: some aspects of her hospitalization. States feeling that events of yesterday (see notes that date) are distracting from her care. Pt states, "I did not take anything, I feel like some staff has been judgmental toward me since then." Pt tearful, expressing she has concerns that record of events yesterday will prevent her from receiving opportunities for care at other facilities in the future. CSW provided emotional support. CSW and pt discussed responsibilities of healthcare providers to document events specifically when safety is a concern, and of limitations of differing perspectives on perception of events. Pt reports she has expressed concerns to various members of leadership but would like to further discuss; CSW attempted to elicit specific issues pt requesting be addresses, response unclear, and encouraged her to process specifically which items remain unaddressed so she can communicate this effectively, pt agreeable. CSW provided contact information for  Patient Experience on AVS, pt agreed to contact via phone. Medical concerns deferred to providers/medical staff- pt understanding.     Final next level of care: [place] Barriers to Discharge: No Barriers Identified   Patient Goals and CMS Choice Patient states their goals for this hospitalization and ongoing recovery are:: get support at home CMS Medicare.gov Compare Post Acute Care list provided to:: Patient Choice offered to / list presented to : Patient   Discharge Plan and Services     Post Acute Care Choice: Home Health                    HH Arranged: RN, Nurse's Aide [facility] Agency: ([place] [facility]) Date [place]: 02/25/19 Time [place]: 1122 Representative spoke with at [place]

## 2019-03-05 NOTE — Plan of Care (Signed)
All goals met d/c 
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
## 2019-03-05 NOTE — Consult Note (Signed)
[place] Nurse wound follow up Repeat consult.  Assessment completed 11/30.  Wound remains intact with maroon discoloration.  Will continue foam dressing as ordered.  Mattress with low air loss feature implemented. No further WOC needs at this time . Will not follow at this time.  Please re-consult if needed.  Domenic Moras MSN, RN, FNP-BC [facility] Wound, Ostomy, Continence Nurse Pager [phone]

## 2019-03-05 NOTE — Discharge Summary (Signed)
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 significant change since prior CT. No free air. Postoperative changes in the thoracolumbar spine. IMPRESSION: Stable small bowel obstruction pattern.  No significant change. Electronically Signed   By: Charlett Nose M.D.   On: 02/15/2019 20:14   Ct Renal Stone Study  Result Date: 02/14/2019 CLINICAL DATA:  Indwelling Foley catheter.  Flank pain.  Pregnant. EXAM: CT ABDOMEN AND PELVIS WITHOUT CONTRAST TECHNIQUE: Multidetector CT imaging of the abdomen and pelvis was performed following the standard protocol without IV contrast. COMPARISON:  06/10/2018 FINDINGS: Lower chest: Lung bases are clear. No effusions. Heart is normal size. Hepatobiliary: No focal hepatic abnormality. Gallbladder unremarkable. Pancreas: No focal abnormality or ductal dilatation. Spleen: No focal abnormality.  Normal size. Adrenals/Urinary Tract: Bilateral nephrolithiasis. The largest stone is in the left midpole measuring 7 mm. No hydronephrosis or ureteral stones. Indwelling suprapubic catheter in  place with urinary bladder decompressed. Adrenal glands unremarkable. Stomach/Bowel: Left lower quadrant ostomy. There are dilated fluid-filled small bowel loops in the left lower abdomen and pelvis. Distal small bowel and  colon are decompressed. Findings compatible with partial small bowel obstruction. Transition point appears to be in the left lower pelvis, presumably adhesions. Vascular/Lymphatic: No evidence of aneurysm or adenopathy. Reproductive: Prior hysterectomy.  No adnexal masses. Other: Small amount of free fluid in the pelvis.  No free air. Musculoskeletal: No acute bony abnormality. Postoperative changes in the lower thoracic and upper lumbar spine. IMPRESSION: Bilateral nephrolithiasis. No ureteral stones or hydronephrosis. Suprapubic catheter in place. Urinary bladder decompressed. Dilated small bowel loops in the left abdomen and pelvis with transition zone in the left lower quadrant. Findings compatible with partial small bowel obstruction, presumably related to adhesions. Electronically Signed   By: Charlett Nose M.D.   On: 02/14/2019 19:09     The results of significant diagnostics from this hospitalization (including imaging, microbiology, ancillary and laboratory) are listed below for reference.     Microbiology: No results found for this or any previous visit (from the past 240 hour(s)).   Labs: BNP (last 3 results) No results for input(s): BNP in the last 8760 hours. Basic Metabolic Panel: Recent Labs  Lab 02/28/19 0807 03/03/19 0312 03/04/19 0255 03/04/19 1423  NA 140 142 139 143  K 3.7 3.3* 3.8 3.8  CL 104 107 106 108  CO2 GLUCOSE 96 114* 104* 100*  BUN <5* 5* 5* 6  CREATININE 0.42* 0.48 0.38* 0.48  CALCIUM 9.0 8.6* 9.1 8.9   Liver Function Tests: No results for input(s): AST, ALT, ALKPHOS, BILITOT, PROT, ALBUMIN in the last 168 hours. No results for input(s): LIPASE, AMYLASE in the last 168 hours. Recent Labs  Lab 03/04/19 1428  AMMONIA 10    CBC: Recent Labs  Lab 03/04/19 1423  WBC 6.0  HGB 11.7*  HCT 38.2  MCV 95.5  PLT 297   Cardiac Enzymes: No results for input(s): CKTOTAL, CKMB, CKMBINDEX, TROPONINI in the last 168 hours. BNP: Invalid input(s): POCBNP CBG: No results for input(s): GLUCAP in the last 168 hours. D-Dimer No results for input(s): DDIMER in the last 72 hours. Hgb A1c No results for input(s): HGBA1C in the last 72 hours. Lipid Profile No results for input(s): CHOL, HDL, LDLCALC, TRIG, CHOLHDL, LDLDIRECT in the last 72 hours. Thyroid function studies No results for input(s): TSH, T4TOTAL, T3FREE, THYROIDAB in the last 72 hours.  Invalid input(s): FREET3 Anemia work up No results for input(s): VITAMINB12, FOLATE, FERRITIN, TIBC, IRON, RETICCTPCT in the last 72 hours. Urinalysis    Component Value Date/Time   COLORURINE YELLOW 02/14/2019 1705   APPEARANCEUR CLOUDY (A) 02/14/2019 1705   LABSPEC 1.010 02/14/2019 1705   PHURINE 7.5 02/14/2019 1705   GLUCOSEU NEGATIVE 02/14/2019 1705   HGBUR NEGATIVE 02/14/2019 1705   BILIRUBINUR NEGATIVE 02/14/2019 1705   KETONESUR NEGATIVE 02/14/2019 1705   PROTEINUR NEGATIVE 02/14/2019 1705   NITRITE NEGATIVE 02/14/2019 1705   LEUKOCYTESUR SMALL (A) 02/14/2019 1705   Sepsis Labs Invalid input(s): PROCALCITONIN,  WBC,  LACTICIDVEN Microbiology No results found for this or any previous visit (from the past 240 hour(s)).   Time coordinating discharge in minutes: 65  SIGNED:   Calvert Cantor, MD  [facility] 03/05/2019, 3:06 PM Pager   If 7PM-7AM, please contact night-coverage [email or web address] Password [email or web address]

## 2019-03-16 ENCOUNTER — Telehealth: Payer: Self-pay | Admitting: Internal Medicine

## 2019-03-16 NOTE — Telephone Encounter (Signed)
Pt returned your call, pls call her again.  °

## 2019-03-16 NOTE — Telephone Encounter (Signed)
Left message for patient to call back  

## 2019-03-18 NOTE — Telephone Encounter (Signed)
Patient is currently admitted to [facility] for recurrent nausea.  She will call back if she needs GI follow up

## 2019-03-20 ENCOUNTER — Encounter (HOSPITAL_BASED_OUTPATIENT_CLINIC_OR_DEPARTMENT_OTHER): Payer: BC Managed Care – PPO | Admitting: Internal Medicine

## 2019-06-22 ENCOUNTER — Other Ambulatory Visit: Payer: Self-pay | Admitting: Urology

## 2019-07-08 NOTE — Patient Instructions (Addendum)
DUE TO COVID-19 ONLY ONE VISITOR IS ALLOWED TO COME WITH YOU AND STAY IN THE WAITING ROOM ONLY DURING PRE OP AND PROCEDURE DAY OF SURGERY. THE 1 VISITOR MAY VISIT WITH YOU AFTER SURGERY IN YOUR PRIVATE ROOM DURING VISITING HOURS ONLY!  10a-8p  YOU NEED TO HAVE A COVID 19 TEST ON ____ @_____ , THIS TEST MUST BE DONE BEFORE SURGERY, COME  [address], [place] , .  [facility]) ONCE YOUR COVID TEST IS COMPLETED, PLEASE BEGIN THE QUARANTINE INSTRUCTIONS AS OUTLINED IN YOUR HANDOUT.                Ashley Gentry  07/08/2019   Your procedure is scheduled on: 07-14-19   Report to [facility] Main  Entrance   Report to admitting at     0845 AM     Call this number if you have problems the morning of surgery [phone]    Remember: Do not eat food or drink liquids :After Midnight.    BRUSH YOUR TEETH MORNING OF SURGERY AND RINSE YOUR MOUTH OUT, NO CHEWING GUM CANDY OR MINTS.     Take these medicines the morning of surgery with A SIP OF WATER: zanaflex, lyrica, lamictal                                 You may not have any metal on your body including hair pins and              piercings  Do not wear jewelry, make-up, lotions, powders or perfumes, deodorant             Do not wear nail polish on your fingernails.  Do not shave  48 hours prior to surgery.           Do not bring valuables to the hospital. [facility] IS NOT             RESPONSIBLE   FOR VALUABLES.  Contacts, dentures or bridgework may not be worn into surgery.      Patients discharged the day of surgery will not be allowed to drive home. IF YOU ARE HAVING SURGERY AND GOING HOME THE SAME DAY, YOU MUST HAVE AN ADULT TO DRIVE YOU HOME AND BE WITH YOU FOR 24 HOURS. YOU MAY GO HOME BY TAXI OR UBER OR ORTHERWISE, BUT AN ADULT MUST ACCOMPANY YOU HOME AND STAY WITH YOU FOR 24 HOURS.  Name and phone number of your driver:  Special Instructions: N/A              Please read over the  following fact sheets you were given: _____________________________________________________________________             [facility] - Preparing for Surgery Before surgery, you can play an important role.  Because skin is not sterile, your skin needs to be as free of germs as possible.  You can reduce the number of germs on your skin by washing with CHG (chlorahexidine gluconate) soap before surgery.  CHG is an antiseptic cleaner which kills germs and bonds with the skin to continue killing germs even after washing. Please DO NOT use if you have an allergy to CHG or antibacterial soaps.  If your skin becomes reddened/irritated stop using the CHG and inform your nurse when you arrive at Short Stay. Do not shave (including legs and underarms) for at least 48 hours prior to the  first CHG shower.  You may shave your face/neck. Please follow these instructions carefully:  1.  Shower with CHG Soap the night before surgery and the  morning of Surgery.  2.  If you choose to wash your hair, wash your hair first as usual with your  normal  shampoo.  3.  After you shampoo, rinse your hair and body thoroughly to remove the  shampoo.                           4.  Use CHG as you would any other liquid soap.  You can apply chg directly  to the skin and wash                       Gently with a scrungie or clean washcloth.  5.  Apply the CHG Soap to your body ONLY FROM THE NECK DOWN.   Do not use on face/ open                           Wound or open sores. Avoid contact with eyes, ears mouth and genitals (private parts).                       Wash face,  Genitals (private parts) with your normal soap.             6.  Wash thoroughly, paying special attention to the area where your surgery  will be performed.  7.  Thoroughly rinse your body with warm water from the neck down.  8.  DO NOT shower/wash with your normal soap after using and rinsing off  the CHG Soap.                9.  Pat yourself dry with a clean  towel.            10.  Wear clean pajamas.            11.  Place clean sheets on your bed the night of your first shower and do not  sleep with pets. Day of Surgery : Do not apply any lotions/deodorants the morning of surgery.  Please wear clean clothes to the hospital/surgery center.  FAILURE TO FOLLOW THESE INSTRUCTIONS MAY RESULT IN THE CANCELLATION OF YOUR SURGERY PATIENT SIGNATURE_________________________________  NURSE SIGNATURE__________________________________  ________________________________________________________________________
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
## 2019-07-09 ENCOUNTER — Encounter (HOSPITAL_COMMUNITY)
Admission: RE | Admit: 2019-07-09 | Discharge: 2019-07-09 | Disposition: A | Discharge status: Home / Self Care | Payer: BC Managed Care – PPO | Source: Ambulatory Visit | Attending: Urology | Admitting: Urology

## 2019-07-09 ENCOUNTER — Other Ambulatory Visit: Payer: Self-pay

## 2019-07-09 ENCOUNTER — Encounter (HOSPITAL_COMMUNITY): Payer: Self-pay | Admitting: Urology

## 2019-07-09 ENCOUNTER — Inpatient Hospital Stay (HOSPITAL_COMMUNITY)
Admission: RE | Admit: 2019-07-09 | Discharge: 2019-07-09 | Disposition: A | Discharge status: Home / Self Care | Payer: BC Managed Care – PPO | Source: Ambulatory Visit

## 2019-07-09 DIAGNOSIS — Z01818 Encounter for other preprocedural examination: Secondary | ICD-10-CM | POA: Insufficient documentation

## 2019-07-09 NOTE — Progress Notes (Signed)
Per Boyce Medici ok for pt to be a same day.

## 2019-07-09 NOTE — Progress Notes (Addendum)
Pt scheduled for surgery 07/14/19. Patient on schedule for covid test today however it is one day too soon. Attempted to contact [facility] PST to question if patient has special circumstance for testing early, left voicemail for PST to return call.  Spoke with Lanice Schwab, RN in PST, patient will have covid test the DOS, todays appointment canceled.

## 2019-07-09 NOTE — Progress Notes (Signed)
Pt. Has never had a legal guardian

## 2019-07-09 NOTE — Progress Notes (Signed)
Pt. Reports a previous bed sore on tail bone and she believes their is a spot that has not healed. Instructed her to let the nurse know when she comes for surgery.

## 2019-07-13 MED ORDER — CEFAZOLIN SODIUM-DEXTROSE 2-4 GM/100ML-% IV SOLN: 2.0000 g | Freq: Once | INTRAVENOUS | Status: DC
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
## 2019-07-14 ENCOUNTER — Ambulatory Visit (HOSPITAL_COMMUNITY): Admission: RE | Admit: 2019-07-14 | Payer: BC Managed Care – PPO | Source: Ambulatory Visit | Admitting: Urology

## 2019-07-14 ENCOUNTER — Telehealth (HOSPITAL_COMMUNITY): Payer: Self-pay | Admitting: *Deleted

## 2019-07-14 HISTORY — DX: Hypotension, unspecified: I95.9

## 2019-07-14 HISTORY — DX: Paralytic syndrome, unspecified: G83.9

## 2019-07-14 HISTORY — DX: Other pulmonary embolism without acute cor pulmonale: I26.99

## 2019-07-14 SURGERY — CYSTOSCOPY/URETEROSCOPY/HOLMIUM LASER/STENT PLACEMENT
Anesthesia: General | Laterality: Bilateral

## 2019-08-06 NOTE — Telephone Encounter (Signed)
Opened in error

## 2020-01-08 ENCOUNTER — Other Ambulatory Visit: Payer: Self-pay | Admitting: Obstetrics and Gynecology

## 2020-01-08 DIAGNOSIS — Z1231 Encounter for screening mammogram for malignant neoplasm of breast: Secondary | ICD-10-CM

## 2020-01-11 ENCOUNTER — Other Ambulatory Visit: Payer: Self-pay

## 2020-01-11 ENCOUNTER — Other Ambulatory Visit: Payer: Self-pay | Admitting: Obstetrics and Gynecology

## 2020-01-11 ENCOUNTER — Ambulatory Visit
Admission: RE | Admit: 2020-01-11 | Discharge: 2020-01-11 | Disposition: A | Discharge status: Home / Self Care | Payer: BC Managed Care – PPO | Source: Ambulatory Visit | Attending: Obstetrics and Gynecology | Admitting: Obstetrics and Gynecology

## 2020-01-11 DIAGNOSIS — N6459 Other signs and symptoms in breast: Secondary | ICD-10-CM

## 2020-01-11 DIAGNOSIS — Z1231 Encounter for screening mammogram for malignant neoplasm of breast: Secondary | ICD-10-CM

## 2020-01-12 ENCOUNTER — Other Ambulatory Visit: Payer: Self-pay | Admitting: Family Medicine

## 2020-01-12 ENCOUNTER — Other Ambulatory Visit: Payer: Self-pay | Admitting: Plastic Surgery

## 2020-01-14 ENCOUNTER — Other Ambulatory Visit: Payer: Self-pay | Admitting: Obstetrics and Gynecology

## 2020-01-14 ENCOUNTER — Ambulatory Visit
Admission: RE | Admit: 2020-01-14 | Discharge: 2020-01-14 | Disposition: A | Discharge status: Home / Self Care | Payer: BC Managed Care – PPO | Source: Ambulatory Visit | Attending: Obstetrics and Gynecology | Admitting: Obstetrics and Gynecology

## 2020-01-14 ENCOUNTER — Other Ambulatory Visit: Payer: Self-pay

## 2020-01-14 DIAGNOSIS — N6459 Other signs and symptoms in breast: Secondary | ICD-10-CM

## 2020-05-20 ENCOUNTER — Ambulatory Visit: Payer: BC Managed Care – PPO | Admitting: Neurology

## 2020-06-10 IMAGING — CT CT RENAL STONE PROTOCOL
2 of 4 series · 16 of 46 positions shown, 18 images · non-contrast
Comparison: 12/05/2017.

CLINICAL DATA: Right flank pain, cloudy urine and chills for the
past 4 days. Paraplegic. The patient uses a catheter urination.

EXAM:
CT ABDOMEN AND PELVIS WITHOUT CONTRAST
TECHNIQUE: Multidetector CT imaging of the abdomen and pelvis was performed
following the standard protocol without IV contrast.

[Series 2: axial st · axial · 0.77mm/px · z∈[-547,-152]mm · 13 of 87 slices shown, 15 images]
[im 4/87  soft-tissue]
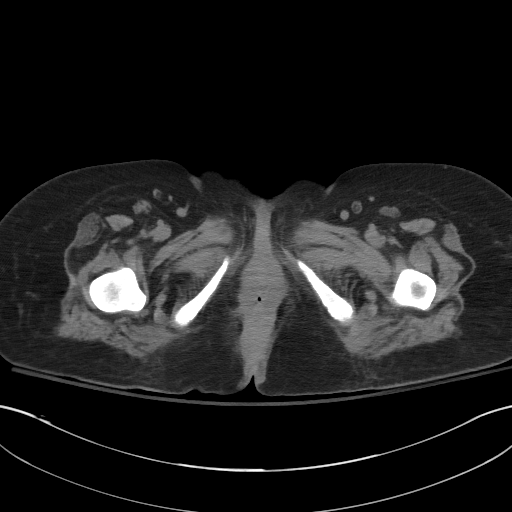
[im 4/87  bone]
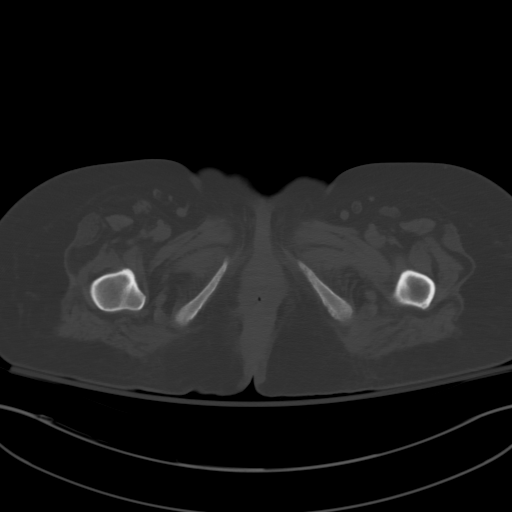
[im 12/87  soft-tissue]
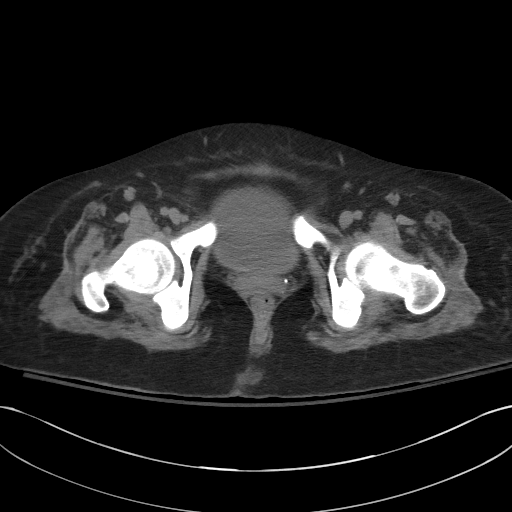
[im 19/87  soft-tissue]
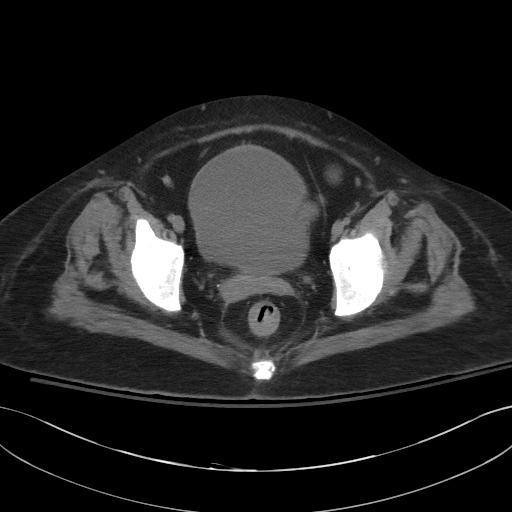
[im 23/87  soft-tissue]
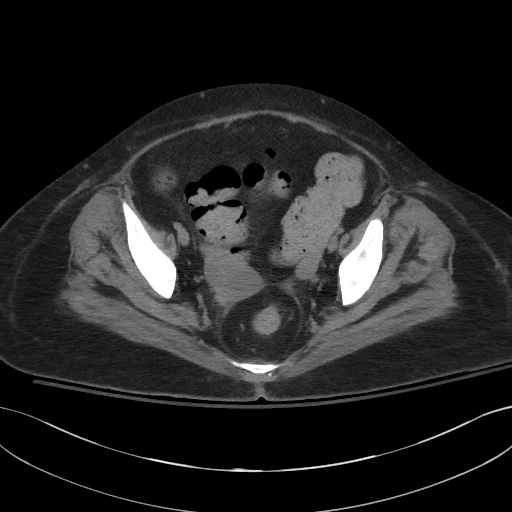
[im 30/87  soft-tissue]
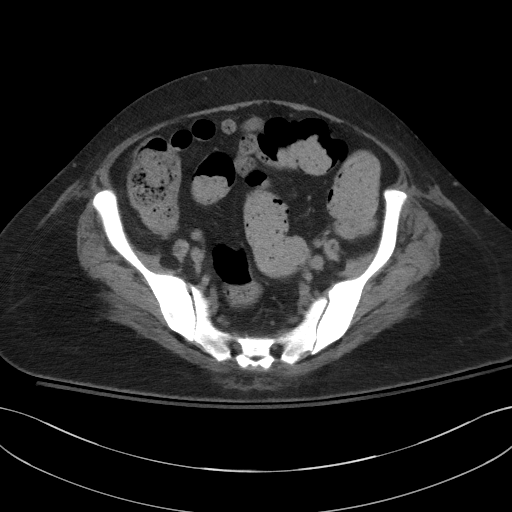
[im 38/87  soft-tissue]
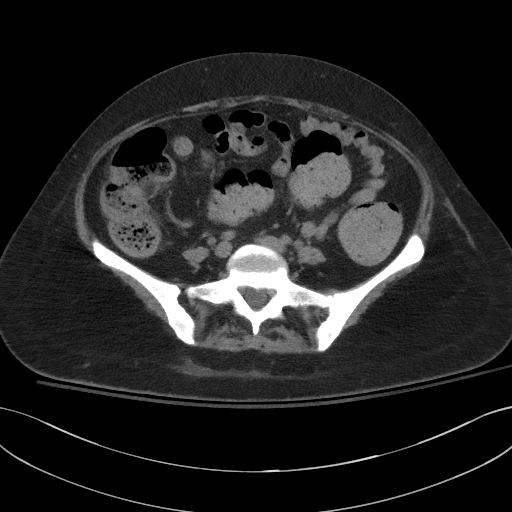
[im 45/87  soft-tissue]
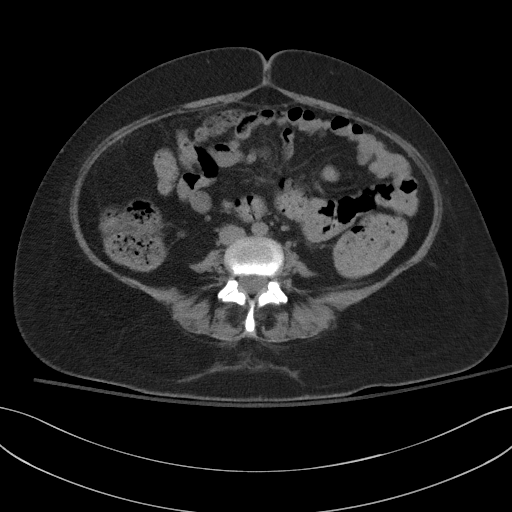
[im 49/87  soft-tissue]
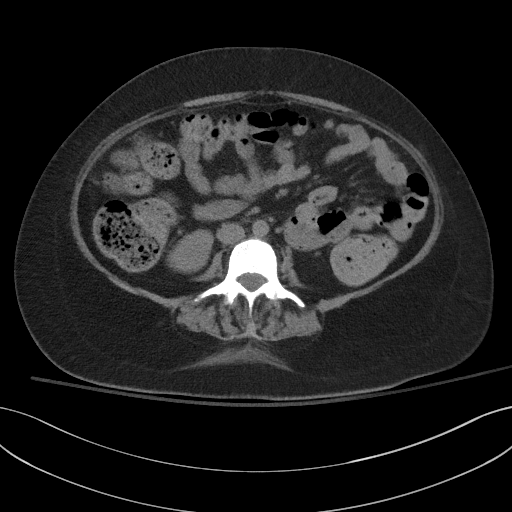
[im 57/87  soft-tissue]
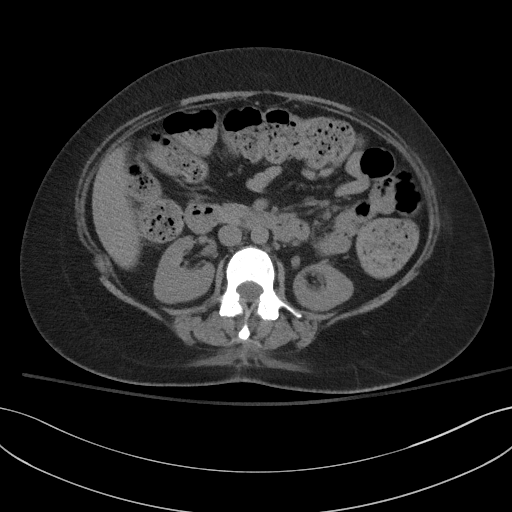
[im 57/87  bone]
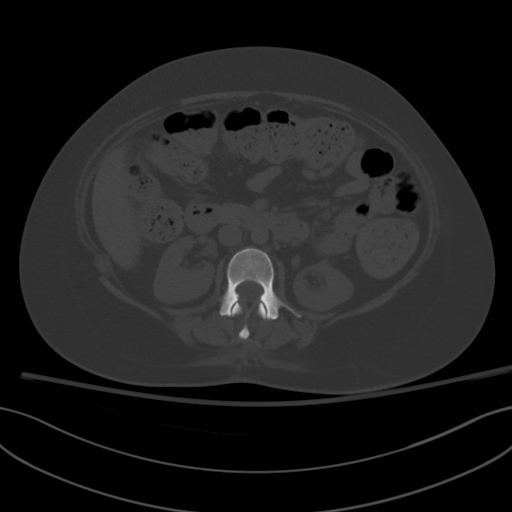
[im 64/87  soft-tissue]
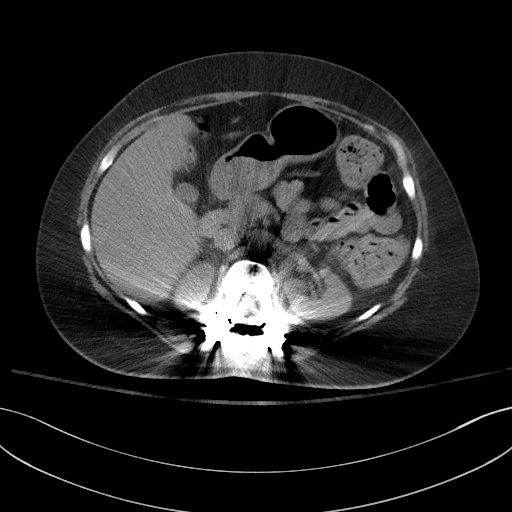
[im 68/87  soft-tissue]
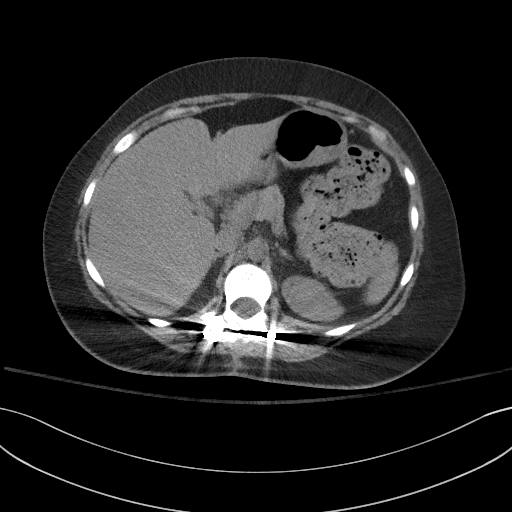
[im 75/87  soft-tissue]
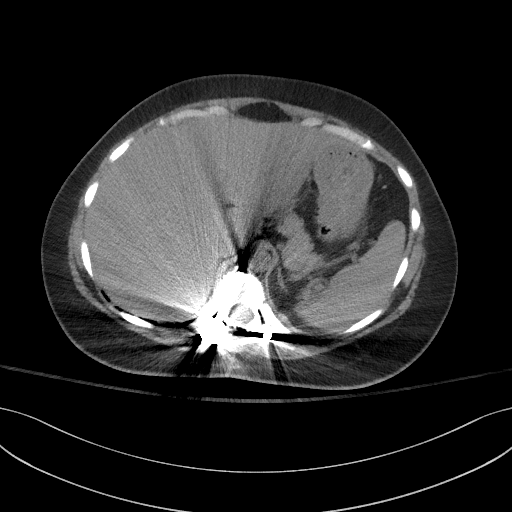
[im 83/87  soft-tissue]
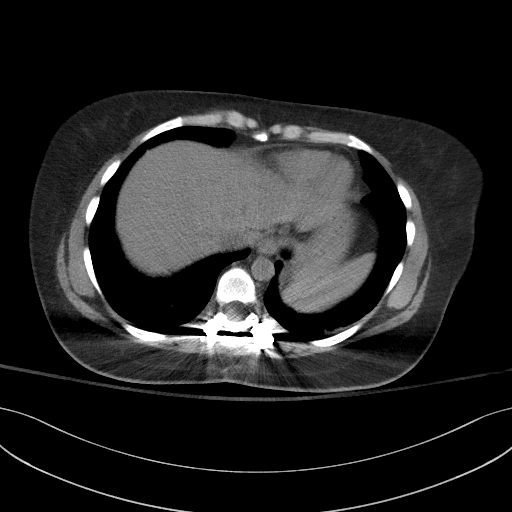

[Series 5: coronal st · coronal · 0.77mm/px · 3 of 81 slices shown]
[im 27/81  soft-tissue]
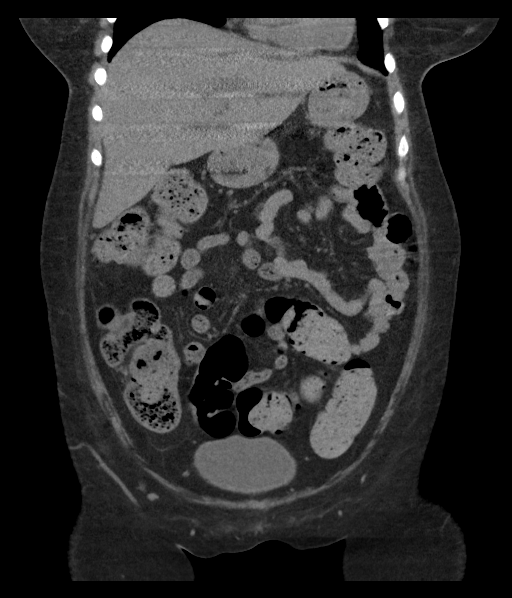
[im 36/81  soft-tissue]
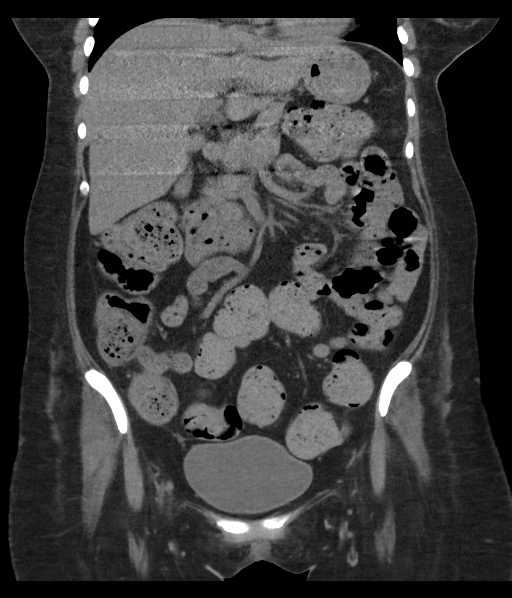
[im 45/81  soft-tissue]
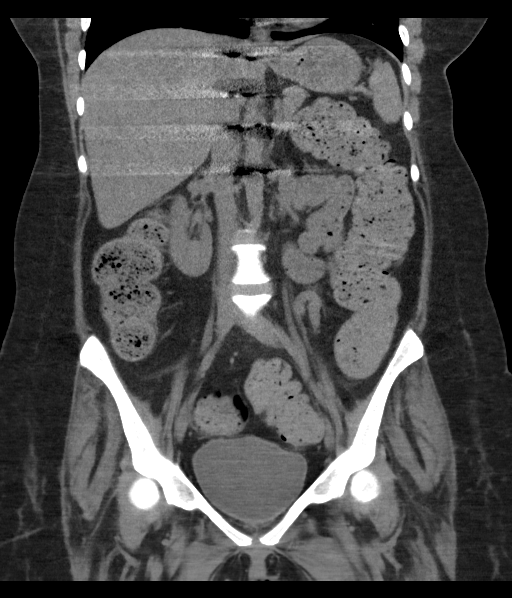

[16 of 46 positions shown; findings below may reference images not displayed]

FINDINGS: Lower chest: Stable minimal left basilar atelectasis or scarring.

Hepatobiliary: No focal liver abnormality is seen. No gallstones,
gallbladder wall thickening, or biliary dilatation.

Pancreas: Unremarkable. No pancreatic ductal dilatation or
surrounding inflammatory changes.

Spleen: Normal in size without focal abnormality.

Adrenals/Urinary Tract: Adrenal glands are unremarkable. Kidneys are
normal, without renal calculi, focal lesion, or hydronephrosis.
Bladder is unremarkable.

Stomach/Bowel: Prominent stool throughout the colon. Unremarkable
stomach and small bowel. No evidence of appendicitis.

Vascular/Lymphatic: No significant vascular findings are present. No
enlarged abdominal or pelvic lymph nodes.

Reproductive: Status post hysterectomy. Interval decrease in size of
the complex right adnexal cystic lesion with no overall increase in
density. This is mildly heterogeneous and currently measures 4.6 x
2.9 cm, previously 4.8 x 4.5 cm. Normal appearing left ovary.

Other: No abdominal wall hernia or abnormality. No abdominopelvic
ascites.

Musculoskeletal: Stable pedicle screw and rod fixation of the
thoracolumbar spine. The remainder the bones are unremarkable.
IMPRESSION: 1. No evidence of obstructive uropathy.
2. Interval decrease in size of the complex right adnexal cystic
lesion, currently measuring 4.6 x 2.9 cm, previously 4.8 x 4.5 cm.
This most likely represents a resolving hemorrhagic cyst. A
follow-up transabdominal and transvaginal pelvic ultrasound is
recommended in 2 months.
3. Prominent stool throughout the colon.

## 2020-07-01 ENCOUNTER — Ambulatory Visit: Payer: BC Managed Care – PPO | Admitting: Neurology

## 2020-07-25 ENCOUNTER — Ambulatory Visit: Payer: BC Managed Care – PPO | Admitting: Neurology

## 2020-09-20 ENCOUNTER — Ambulatory Visit (INDEPENDENT_AMBULATORY_CARE_PROVIDER_SITE_OTHER): Payer: Medicare PPO | Admitting: Neurology

## 2020-09-20 ENCOUNTER — Encounter: Payer: Self-pay | Admitting: Neurology

## 2020-09-20 ENCOUNTER — Telehealth: Payer: Self-pay

## 2020-09-20 VITALS — BP 114/68 | HR 93 | Ht 62.0 in | Wt 165.0 lb

## 2020-09-20 DIAGNOSIS — G8220 Paraplegia, unspecified: Secondary | ICD-10-CM

## 2020-09-20 DIAGNOSIS — N319 Neuromuscular dysfunction of bladder, unspecified: Secondary | ICD-10-CM

## 2020-09-20 DIAGNOSIS — L89159 Pressure ulcer of sacral region, unspecified stage: Secondary | ICD-10-CM | POA: Diagnosis not present

## 2020-09-20 MED ORDER — CARBAMAZEPINE 200 MG PO TABS: ORAL_TABLET | ORAL | 3 refills | Status: DC
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
## 2020-09-20 NOTE — Telephone Encounter (Signed)
Referral for PMR sent to Dr. Jodean Lima office. P: [phone].  Referral to Urology sent to [facility]. P: [phone].

## 2020-09-26 NOTE — Telephone Encounter (Signed)
Referral for Home Health sent to [facility]. P: [phone].
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
## 2020-09-27 NOTE — Telephone Encounter (Addendum)
[facility] declined referral because they are not in network with [employer].  Checking again with [facility] and [facility].

## 2020-09-27 NOTE — Telephone Encounter (Signed)
[facility] declined referral because they are not in network with [employer].

## 2020-10-04 ENCOUNTER — Ambulatory Visit: Payer: Medicare PPO | Admitting: Neurology

## 2020-10-04 ENCOUNTER — Telehealth: Payer: Self-pay | Admitting: Neurology
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
## 2020-10-04 NOTE — Telephone Encounter (Signed)
I have reached out to [facility] to see if they can accept this pt.
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
## 2020-10-04 NOTE — Telephone Encounter (Signed)
Pt called, today having some dizziness, want to know if this can be a side effect of the medication Dr. Anne Hahn prescribed. Would like a call from the nurse.
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
## 2020-10-05 ENCOUNTER — Ambulatory Visit: Payer: Medicare PPO | Admitting: Neurology
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
## 2020-10-05 NOTE — Telephone Encounter (Signed)
I have reached out to [facility].

## 2020-10-07 ENCOUNTER — Encounter: Payer: Self-pay | Admitting: Physical Medicine and Rehabilitation

## 2020-10-10 NOTE — Addendum Note (Signed)
Addended by: Geronimo Running A on: 10/10/2020 02:04 PM   Modules accepted: Orders

## 2020-10-10 NOTE — Telephone Encounter (Signed)
[facility] has declined this referral due to staffing issues.
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
## 2020-10-10 NOTE — Telephone Encounter (Signed)
[facility] has declined this referral due to staffing issues.

## 2020-10-25 ENCOUNTER — Telehealth: Payer: Self-pay | Admitting: Neurology

## 2020-10-25 NOTE — Telephone Encounter (Signed)
Pt called to confirm referral that have been sent and phone numbers. Would like a call from nurse.

## 2020-10-25 NOTE — Telephone Encounter (Signed)
I called pt. No answer, left a message asking pt to call me back.   

## 2020-10-31 ENCOUNTER — Telehealth: Payer: Self-pay | Admitting: Neurology

## 2020-10-31 DIAGNOSIS — M4714 Other spondylosis with myelopathy, thoracic region: Secondary | ICD-10-CM

## 2020-10-31 DIAGNOSIS — G959 Disease of spinal cord, unspecified: Secondary | ICD-10-CM

## 2020-10-31 NOTE — Telephone Encounter (Signed)
Late entry: Patient called after-hours call center on 10/29/2020.  I talked to the patient at the time.  She reported that she is paraplegic and wheelchair-bound and recently had a sudden twisting sensation when she was transferring.  She did not fall, no acute pain was reported in her upper back.  She has feeling from the shoulder blades up.  She reports since her transfer incident she has had intermittent dizziness and nausea, also feels intermittent numbness in between her shoulder blades where she typically has feeling.  She was recently seen by Dr. Anne Hahn.  She has an appointment pending later this year, she is wondering if she needs a sooner appointment or a scan.  She did not have any symptoms that sounded emergent.  We mutually agreed to monitor her symptoms over the weekend.  She had taken Zofran for nausea.  She did not feel that her symptoms were bad enough to go to the emergency room.  I advised her that I would send a message to her primary neurologist for review.  She was also encouraged to send a MyChart message directly.

## 2020-10-31 NOTE — Telephone Encounter (Signed)
I called the patient.  About 5 days ago she was transferring on a board, the patient slipped off, did not fall the ground but hit her rear end on the wheel of the wheelchair, someone helped her back into the wheelchair.  Since that time, she has had altered sensation in the upper shoulder area that is new.  She has not lost any function in the arms per se.  Given these new symptoms, we will check MRI of the thoracic spine, I will try to get her worked in to evaluate her.

## 2020-10-31 NOTE — Telephone Encounter (Signed)
I called the patient.  I left a message, I will call back later.  The patient apparently had some troubles over the weekend.

## 2020-10-31 NOTE — Telephone Encounter (Signed)
I will get a cervical MRI.

## 2020-10-31 NOTE — Addendum Note (Signed)
Addended by: York Spaniel on: 10/31/2020 05:32 PM   Modules accepted: Orders
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
## 2020-11-01 ENCOUNTER — Telehealth: Payer: Self-pay | Admitting: Neurology

## 2020-11-01 NOTE — Telephone Encounter (Signed)
MRI cervical spine wo contrast & thoracic spine wo contrast [employer] auth: [id number] (11/01/20-12/01/20) sent to GI for scheduling

## 2020-11-03 ENCOUNTER — Telehealth: Payer: Self-pay | Admitting: Neurology

## 2020-11-03 ENCOUNTER — Other Ambulatory Visit (HOSPITAL_COMMUNITY): Payer: Self-pay | Admitting: Neurology

## 2020-11-03 DIAGNOSIS — G959 Disease of spinal cord, unspecified: Secondary | ICD-10-CM

## 2020-11-03 DIAGNOSIS — M4714 Other spondylosis with myelopathy, thoracic region: Secondary | ICD-10-CM
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
## 2020-11-03 NOTE — Telephone Encounter (Signed)
Changed Ashley Gentry and faxed order to [facility] scheduling. They will reach out to patient to schedule MRIs.

## 2020-11-14 ENCOUNTER — Other Ambulatory Visit: Payer: Self-pay

## 2020-11-14 ENCOUNTER — Ambulatory Visit (HOSPITAL_COMMUNITY)
Admission: RE | Admit: 2020-11-14 | Discharge: 2020-11-14 | Disposition: A | Discharge status: Home / Self Care | Payer: Medicare PPO | Source: Ambulatory Visit | Attending: Neurology | Admitting: Neurology

## 2020-11-14 DIAGNOSIS — M4714 Other spondylosis with myelopathy, thoracic region: Secondary | ICD-10-CM

## 2020-11-14 DIAGNOSIS — G959 Disease of spinal cord, unspecified: Secondary | ICD-10-CM | POA: Diagnosis present

## 2020-11-16 ENCOUNTER — Telehealth: Payer: Self-pay | Admitting: Neurology

## 2020-11-16 NOTE — Telephone Encounter (Signed)
I tried to call the patient again, unable to leave a message, I will try 1 more time this afternoon.

## 2020-11-16 NOTE — Telephone Encounter (Signed)
I tried to call the patient again, unable to reach her, I will call back later.

## 2020-11-16 NOTE — Telephone Encounter (Signed)
I tried to call the patient regarding the results, unable to reach her, I will try to call later.   MRI cervical and thoracic 11/15/20:  IMPRESSION: 1. Status post posterior instrumented fusion from T8 through the lumbar spine; the inferior extent of the hardware is incompletely imaged. 2. Unchanged slight focal kyphosis centered at C5-C6 and grade 1 anterolisthesis of T10 on T11. Alignment is otherwise normal. 3. Marked cord atrophy from T8-T9 through the conus medullaris, the inferior extent of which is incompletely imaged, overall similar to the prior study. More mild cord atrophy in the upper thoracic spine with focal signal abnormality at T3-T4 is also similar to the prior study; no evidence of enlarging syrinx. 4. No abnormal enhancement.

## 2020-11-16 NOTE — Telephone Encounter (Signed)
I called the patient again, once again unable to reach her.  I have an appointment to see her on 22 August, I will discuss the results with her at that time.

## 2020-11-21 ENCOUNTER — Encounter: Payer: Self-pay | Admitting: Neurology

## 2020-11-21 ENCOUNTER — Ambulatory Visit: Payer: Medicare PPO | Admitting: Neurology

## 2020-11-21 VITALS — BP 126/80 | HR 107 | Ht 62.0 in | Wt 167.0 lb

## 2020-11-21 DIAGNOSIS — K592 Neurogenic bowel, not elsewhere classified: Secondary | ICD-10-CM

## 2020-11-21 DIAGNOSIS — Z433 Encounter for attention to colostomy: Secondary | ICD-10-CM

## 2020-11-21 DIAGNOSIS — R202 Paresthesia of skin: Secondary | ICD-10-CM

## 2020-11-21 DIAGNOSIS — N319 Neuromuscular dysfunction of bladder, unspecified: Secondary | ICD-10-CM | POA: Diagnosis not present

## 2020-11-21 DIAGNOSIS — S24103S Unspecified injury at T7-T10 level of thoracic spinal cord, sequela: Secondary | ICD-10-CM

## 2020-11-21 MED ORDER — METHOCARBAMOL 500 MG PO TABS: 500.0000 mg | ORAL_TABLET | Freq: Three times a day (TID) | ORAL | 3 refills | Status: DC

## 2020-11-21 MED ORDER — DULOXETINE HCL 30 MG PO CPEP: 30.0000 mg | ORAL_CAPSULE | Freq: Two times a day (BID) | ORAL | 3 refills | Status: DC
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
## 2020-11-21 NOTE — Patient Instructions (Addendum)
We will reduce the methocarbamol to 500 mg three times a day.  We will increase the cymbalta to 30 mg twice a day.  Cymbalta (duloxetine) is an antidepressant medication that is commonly used for peripheral neuropathy pain or for fibromyalgia pain. As with any antidepressant medication, worsening depression can be seen. This medication can potentially cause headache, dizziness, sexual dysfunction, or nausea. If any problems are noted on this medication, please contact our office.

## 2020-11-22 ENCOUNTER — Telehealth: Payer: Self-pay | Admitting: Neurology

## 2020-11-22 NOTE — Telephone Encounter (Signed)
Francine Graven Berkley Harvey: [id number] (exp. 11/22/20 to 12/22/20)  Patient is scheduled at [facility] long for Wednesday 11/30/20 to arrive at 2:30 pm. I left a voicemail with this information and left their number of [phone] incase she needed to r/s.

## 2020-11-23 ENCOUNTER — Telehealth: Payer: Self-pay | Admitting: Neurology

## 2020-11-23 MED ORDER — PROMETHAZINE HCL 25 MG PO TABS: 25.0000 mg | ORAL_TABLET | Freq: Four times a day (QID) | ORAL | 2 refills | Status: DC | PRN

## 2020-11-23 NOTE — Telephone Encounter (Signed)
I called the patient.  The patient's had some issues with nausea and vomiting today.  She has not had any other symptoms of autonomic dysreflexia with sweats or flushing.  Similar problems occurred earlier in the month, 3 to 4 weeks ago.  The patient has been set up to see a gastroenterologist in the near future for further evaluation.  The patient is not getting much benefit with the nausea taking Zofran, I will convert her to Phenergan.

## 2020-11-23 NOTE — Telephone Encounter (Signed)
Pt called, wanted Dr. Anne Hahn to know started having some dizziness today. Would like a call from the nurse.

## 2020-11-29 ENCOUNTER — Other Ambulatory Visit: Payer: Self-pay | Admitting: Obstetrics and Gynecology

## 2020-11-29 DIAGNOSIS — Z1231 Encounter for screening mammogram for malignant neoplasm of breast: Secondary | ICD-10-CM

## 2020-11-30 ENCOUNTER — Ambulatory Visit (HOSPITAL_COMMUNITY): Admission: RE | Admit: 2020-11-30 | Payer: Medicare PPO | Source: Ambulatory Visit

## 2020-12-01 ENCOUNTER — Telehealth: Payer: Self-pay | Admitting: Neurology

## 2020-12-01 DIAGNOSIS — S24111S Complete lesion at T1 level of thoracic spinal cord, sequela: Secondary | ICD-10-CM

## 2020-12-01 NOTE — Telephone Encounter (Signed)
Pt called wanting to discuss with RN the nausea that her DULoxetine (CYMBALTA) 30 MG capsule may be causing her. Please advise.

## 2020-12-01 NOTE — Telephone Encounter (Signed)
I called the patient.  The patient is having ongoing nausea, she is not sure whether going up on the Cymbalta may have worsened this, could cut back on this to once a day and evaluate.   She wishes to have home health nursing out to the house, we have ordered this previously but multiple agencies have turned her down, there was some indication that [facility] would go out to the home, but the patient has not heard anything.  I will replace another order for home health therapy.  In the past, the patient has not been good about answering her telephone and her voicemail system is full, indicated that she needs to be available to answer the telephone when someone calls.  She has not yet heard about the GI referral that was made earlier.

## 2020-12-06 ENCOUNTER — Telehealth: Payer: Self-pay | Admitting: Neurology

## 2020-12-06 MED ORDER — PREGABALIN 200 MG PO CAPS: 200.0000 mg | ORAL_CAPSULE | Freq: Two times a day (BID) | ORAL | 1 refills | Status: DC

## 2020-12-06 NOTE — Telephone Encounter (Signed)
The Lyrica will be refilled.

## 2020-12-06 NOTE — Telephone Encounter (Signed)
Pt is requesting a refill for pregabalin (LYRICA) 200 MG capsule .  Pharmacy:  [facility] [phone]

## 2020-12-12 NOTE — Telephone Encounter (Signed)
I reached out to [facility] @ [phone]. I was routed to the voicemail of the manager Marvia Pickles. I left a message requesting a call back to discuss the referral.

## 2020-12-14 NOTE — Telephone Encounter (Signed)
Ashley Gentry with Ashley Gentry returned my call. I explained that referral was placed in June for home PT and home health aid. Explained patient is paraplegic and has spinal cord injury. Ashley Gentry could not find anything in the system for this patient, doesn't appear that anyone followed up with patient after Ashley Gentry reached out in June. 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   After a brief discussion of the situation, she states that her nurse care supervisor is out until tomorrow. She will get patient's information to her for review. She will reach out to patient today to touch base so patient knows referral is being reviewed. She states she will get back to me either tomorrow or next week if anything else is needed.

## 2020-12-15 ENCOUNTER — Other Ambulatory Visit: Payer: Self-pay | Admitting: Urology

## 2020-12-20 ENCOUNTER — Encounter (HOSPITAL_COMMUNITY): Payer: Self-pay | Admitting: Physician Assistant

## 2020-12-20 ENCOUNTER — Encounter (HOSPITAL_COMMUNITY): Payer: Self-pay | Admitting: Urology

## 2020-12-20 ENCOUNTER — Other Ambulatory Visit: Payer: Self-pay
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
## 2020-12-20 NOTE — Patient Instructions (Signed)
DUE TO COVID-19 ONLY ONE VISITOR IS ALLOWED TO COME WITH YOU AND STAY IN THE WAITING ROOM ONLY DURING PRE OP AND PROCEDURE.   **NO VISITORS ARE ALLOWED IN THE SHORT STAY AREA OR RECOVERY ROOM!!**        Your procedure is scheduled on: Tuesday, Sept 27, 2022   Report to [facility] Main  Entrance    Report to admitting at 10:45 AM   Call this number if you have problems the morning of surgery [phone]   Do not eat food :After Midnight.   May have liquids until   10AM day of surgery  CLEAR LIQUID DIET  Foods Allowed                                                                     Foods Excluded  Water, Black Coffee and tea, regular and decaf                             liquids that you cannot  Plain Jell-O in any flavor  (No red)                                           see through such as: Fruit ices (not with fruit pulp)                                     milk, soups, orange juice              Iced Popsicles (No red)                                    All solid food                                   Apple juices Sports drinks like Gatorade (No red) Lightly seasoned clear broth or consume(fat free) Sugar, honey syrup  Sample Menu Breakfast                                Lunch                                     Supper Cranberry juice                    Beef broth                            Chicken broth Jell-O                                     Grape juice  Apple juice Coffee or tea                        Jell-O                                      Popsicle                                                Coffee or tea                        Coffee or tea     Oral Hygiene is also important to reduce your risk of infection.                                    Remember - BRUSH YOUR TEETH THE MORNING OF SURGERY WITH YOUR REGULAR TOOTHPASTE   Do NOT smoke after Midnight   Take these medicines the morning of surgery with A SIP OF WATER:  Duloxetine, Lamotrigine, Methocarbamol, Pregablin                              You may not have any metal on your body including hair pins, jewelry, and body piercing             Do not wear make-up, lotions, powders, perfumes/cologne, or deodorant  Do not wear nail polish including gel and S&S, artificial/acrylic nails, or any other type of covering on natural nails including finger and toenails. If you have artificial nails, gel coating, etc. that needs to be removed by a nail salon please have this removed prior to surgery or surgery may need to be canceled/ delayed if the surgeon/ anesthesia feels like they are unable to be safely monitored.   Do not shave  48 hours prior to surgery.    Do not bring valuables to the hospital. [facility] IS NOT             RESPONSIBLE   FOR VALUABLES.   Contacts, dentures or bridgework may not be worn into surgery.    Patients discharged the day of surgery will not be allowed to drive home.   Special Instructions: Bring a copy of your healthcare power of attorney and living will documents         the day of surgery if you haven't scanned them in before.              Please read over the following fact sheets you were given: IF YOU HAVE QUESTIONS ABOUT YOUR PRE OP INSTRUCTIONS PLEASE CALL [phone]
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
## 2020-12-27 ENCOUNTER — Encounter (HOSPITAL_COMMUNITY): Admission: RE | Payer: Self-pay | Source: Home / Self Care

## 2020-12-27 ENCOUNTER — Ambulatory Visit (HOSPITAL_COMMUNITY): Admission: RE | Admit: 2020-12-27 | Payer: Medicare PPO | Source: Home / Self Care | Admitting: Urology

## 2020-12-27 HISTORY — DX: Other specified personal risk factors, not elsewhere classified: Z91.89

## 2020-12-27 SURGERY — BOTOX INJECTION
Anesthesia: General

## 2020-12-29 NOTE — Telephone Encounter (Signed)
Got an extenstion for the auth with [employer]. [id number] (exp. 02/08/21) patient no showed her appointment. I r/s her for 01/04/21 at [facility]. I am not able to get a hold of the patient because her mail box is full.

## 2021-01-02 ENCOUNTER — Encounter: Payer: Medicare PPO | Attending: Physical Medicine and Rehabilitation | Admitting: Physical Medicine and Rehabilitation

## 2021-01-04 ENCOUNTER — Ambulatory Visit (HOSPITAL_COMMUNITY): Payer: Medicare PPO

## 2021-01-04 ENCOUNTER — Other Ambulatory Visit: Payer: Self-pay | Admitting: Urology

## 2021-01-09 ENCOUNTER — Ambulatory Visit (HOSPITAL_COMMUNITY): Payer: Medicare PPO

## 2021-01-10 ENCOUNTER — Other Ambulatory Visit: Payer: Self-pay

## 2021-01-10 ENCOUNTER — Ambulatory Visit: Payer: Medicare PPO | Attending: Neurology | Admitting: Physical Therapy

## 2021-01-10 DIAGNOSIS — G8221 Paraplegia, complete: Secondary | ICD-10-CM | POA: Insufficient documentation

## 2021-01-11 ENCOUNTER — Encounter: Payer: Self-pay | Admitting: Physical Therapy
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
## 2021-01-12 ENCOUNTER — Other Ambulatory Visit: Payer: Self-pay

## 2021-01-12 ENCOUNTER — Telehealth: Payer: Self-pay | Admitting: Neurology

## 2021-01-12 ENCOUNTER — Ambulatory Visit (HOSPITAL_COMMUNITY)
Admission: RE | Admit: 2021-01-12 | Discharge: 2021-01-12 | Disposition: A | Discharge status: Home / Self Care | Payer: Medicare PPO | Source: Ambulatory Visit | Attending: Neurology | Admitting: Neurology

## 2021-01-12 DIAGNOSIS — R202 Paresthesia of skin: Secondary | ICD-10-CM

## 2021-01-12 NOTE — Telephone Encounter (Signed)
    MRI brain 01/12/21:  IMPRESSION: Mildly motion degraded exam.   No evidence of acute intracranial abnormality.   Unchanged 3 mm nonspecific remote white matter insult within the right corona radiata.   Mild paranasal sinus mucosal thickening.

## 2021-01-13 MED ORDER — TIZANIDINE HCL 2 MG PO TABS: 2.0000 mg | ORAL_TABLET | Freq: Three times a day (TID) | ORAL | 2 refills | Status: DC

## 2021-01-13 NOTE — Telephone Encounter (Signed)
I called the patient.  MRI of the brain was essentially normal, single punctate spot in the right brain was noted.  No evidence of demyelinating disease.  Nothing to explain the left hemisensory deficit.  The patient is off of methocarbamol, I will add tizanidine to her regimen currently.

## 2021-01-13 NOTE — Telephone Encounter (Signed)
I tried to call the patient regarding the MRI results, the MRI is essentially normal.  No explanation for left-sided sensory changes.  I will try to call her back later.

## 2021-01-19 ENCOUNTER — Telehealth: Payer: Self-pay | Admitting: Neurology

## 2021-01-19 NOTE — Telephone Encounter (Signed)
Message was relayed to Misty Stanley, Phone rm, to advised pt she needs to contact the surgeon regarding incision as pt was concern is as a result of the catheter being changed as to there being na infection. Pt just moved back to [place] and was advised if she can not see surgeon,  advised to call them, go to ED or urgent care.  Pt states a Charity fundraiser from [facility] is on her way to her home and that she will have the RN look at it and to call in when she gets to her home.

## 2021-01-19 NOTE — Telephone Encounter (Deleted)
Pt is asking that Joanne@ [email or web address](AMB REFERRAL TO HOME HEALTH) be called be called re: the nerves and bladder situation and discharge with possible infection.  Randa Evens can be reached at [phone]

## 2021-01-26 ENCOUNTER — Ambulatory Visit: Payer: Medicare PPO | Admitting: Neurology

## 2021-01-26 ENCOUNTER — Telehealth: Payer: Self-pay | Admitting: Neurology

## 2021-01-26 NOTE — Telephone Encounter (Signed)
I tried to call the patient, unable to get through, I will try to call back later.

## 2021-01-26 NOTE — Telephone Encounter (Signed)
Patient was 22 min late for her apt today and was not seen Would like a call back from the nurse regarding her symptoms and her MRI results that were already discussed over the phone as she is having an "off" day. Best call back [phone]

## 2021-01-26 NOTE — Telephone Encounter (Signed)
I called the patient again, patient did not answer, I will try again tomorrow morning.

## 2021-01-27 ENCOUNTER — Other Ambulatory Visit: Payer: Self-pay

## 2021-01-27 ENCOUNTER — Encounter (HOSPITAL_COMMUNITY): Payer: Self-pay | Admitting: Urology
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
## 2021-01-27 NOTE — Telephone Encounter (Signed)
I attempted to call the patient again.  Once again, unable to leave a message or reach the patient.  If the patient still has questions or needs advice, she will need to call our office back and leave a telephone number where she can be reached.

## 2021-01-31 ENCOUNTER — Encounter (HOSPITAL_COMMUNITY): Payer: Self-pay | Admitting: Physician Assistant
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
## 2021-02-06 ENCOUNTER — Telehealth: Payer: Self-pay | Admitting: Neurology

## 2021-02-06 NOTE — Telephone Encounter (Signed)
Called and was able to offer the pt a sooner opening. Pt was appreciative for the call back and accepted the apt for 11/9 at 9 am with check in of 8:30 am. She advised that she has a outpt procedure scheduled for 11/8 and there shouldn't be any complications but wanted [place] to know in case something does happen. I advised that I would document this on our end.

## 2021-02-06 NOTE — Telephone Encounter (Signed)
Late entry: I received a after-hours call message on 02/05/2021 at 2:50 PM.  I was able to call the patient back on the number listed.  She reported that she had fallen out of her wheelchair while transferring from her wheelchair.  She reported that her was able to use that her hoyer lift and get her back up. She reported that she had fallen forward and then backward and landed with an awkward position of her leg, like a yoga position.  She reported knee pain and left-sided pain and was not sure if she needed a scan.  Based on her fall I suggested that she be evaluated immediately in the emergency room to get at least x-rays.  She declined going to the emergency room reporting that she does not feel like she has to go to the ER for this.  She reported that she recently missed an appointment in this clinic and that she is scheduled to see Dr. Epimenio Foot at that point to order a scan.  I explained to her that since she will be new to Dr. Epimenio Foot may not be able to order a scan based on her recent fall.  It may be more helpful to get evaluated on a more urgent basis in the ER as she had fallen.  She requested that her doctor be notified and declined going to the emergency room.  She was advised to call the office during 1 of those to reschedule her missed appointment.  She demonstrated understanding and agreement.    Upon chart review, she is scheduled to see Dr. Epimenio Foot in January 2023.

## 2021-02-07 ENCOUNTER — Ambulatory Visit (HOSPITAL_COMMUNITY): Admission: RE | Admit: 2021-02-07 | Payer: Medicare PPO | Source: Home / Self Care | Admitting: Urology

## 2021-02-07 DIAGNOSIS — N319 Neuromuscular dysfunction of bladder, unspecified: Secondary | ICD-10-CM

## 2021-02-07 SURGERY — CYSTOURETEROSCOPY, WITH STENT INSERTION
Anesthesia: General

## 2021-02-07 NOTE — Progress Notes (Signed)
Patient phoned stating that she was having issues with transportation for her surgery today.  Patient was advised that she needed to be here at 1145 for her 2:00 PM surgery.  She will keep working on transportation and will call Short Stay if unable to find a ride.  I also notified Myriam Jacobson in [place] Stay of this as well.

## 2021-02-08 ENCOUNTER — Ambulatory Visit: Payer: Medicare PPO | Admitting: Neurology

## 2021-02-17 ENCOUNTER — Encounter: Payer: Medicare PPO | Admitting: Physical Medicine and Rehabilitation

## 2021-02-22 ENCOUNTER — Telehealth: Payer: Self-pay | Admitting: Neurology

## 2021-02-22 ENCOUNTER — Other Ambulatory Visit: Payer: Self-pay | Admitting: *Deleted

## 2021-02-22 MED ORDER — PREGABALIN 200 MG PO CAPS: 200.0000 mg | ORAL_CAPSULE | Freq: Two times a day (BID) | ORAL | 0 refills | Status: DC

## 2021-02-22 NOTE — Telephone Encounter (Addendum)
Tried calling pt back. Went to VM, VM not set up. Looks like she had appt w/ Dr. Epimenio Foot 02/08/21 for TOC from Dr. Anne Hahn but cx d/t transportation issues. She is now scheduled for 04/18/21.   Last rx for Lyrica sent to [id number] - [place], [place] - [address]. Sent on 12/06/20 #180, 1 refills. Should have enough medication until 06/05/2021.   We will not be able to make any medication adjustments until she is seen by Dr. Epimenio Foot since she is establishing with him. No sooner appt available right now.   Pt called back and I took call from phone staff. Pt moved a couple months back. I updated address on file. She normally keeps all medication in one container. She is missing the bottle of Lyrica that she filled 12/06/20. Wanting to know if Dr. Epimenio Foot will authorize pharmacy to go ahead and fill refill that is left now since she has no med left. Aware I will see if MD approves. If he does, I will call pharmacy. If he does not, I will call her back. She verbalized understanding.  Checked drug registry. She last refilled 12/07/20 #180.   Aware no med changes will be made until she sees Dr. Epimenio Foot.

## 2021-02-22 NOTE — Telephone Encounter (Signed)
Pt is asking for a call to discuss her pregabalin (LYRICA) 200 MG capsule.  Pt states the pharmacy mentioned her needing an early fill on it & Pt is also wanting to discuss possibly going back on Gabapentin in place of Lyrica.

## 2021-02-22 NOTE — Telephone Encounter (Signed)
I called [facility] and spoke w/ pharmacist. They were able to take VO to refill rx early. Nothing further needed. I called pt to let her know. She verbalized understanding and appreciation.

## 2021-02-22 NOTE — Telephone Encounter (Signed)
Pt called, nurse that can go pick up medication leaves at 3 pm. Want to make you are aware.

## 2021-02-22 NOTE — Telephone Encounter (Signed)
Dr. Epimenio Foot- are you ok with providing early refill?

## 2021-02-22 NOTE — Telephone Encounter (Signed)
Sent refill to Dr. Epimenio Foot to e-scribe to pharmacy. Asking they refill early for pt.

## 2021-03-08 ENCOUNTER — Telehealth: Payer: Self-pay | Admitting: Neurology

## 2021-03-08 NOTE — Addendum Note (Signed)
Addended by: Arther Abbott on: 03/08/2021 02:38 PM   Modules accepted: Orders

## 2021-03-08 NOTE — Telephone Encounter (Signed)
Pt is asking for a call to discuss questions and side affects to tiZANidine (ZANAFLEX) 2 MG tablet

## 2021-03-08 NOTE — Telephone Encounter (Addendum)
Called and spoke w/ pt. She woke up this am w/ strong cramps on left side. Not very painful, just unusual for her. Has tizanidine. As soon as she takes this, sx resolved. Works well. She will try and get refill from pharmacy for this and continue this. She no longer takes methocarbamol. I updated med list.   She cannot get comfortable laying down or sitting. Feels spine is not aligned. This is new for her in the last 3-4 days. Having pain in her lower/mid back. Unsure if muscle related. Does not feel this is the case. Wondering what Dr. Epimenio Foot recommends. Aware she may need to come in for appt for evaluation before determination can be made. Will watch for cx and will also speak w/ MD about recommendation. Aware we will call back.

## 2021-03-09 MED ORDER — METHYLPREDNISOLONE 4 MG PO TBPK: ORAL_TABLET | ORAL | 0 refills | Status: DC

## 2021-03-09 NOTE — Telephone Encounter (Signed)
Pt returned call and I was able to review the information with the patient. She was appreciative and had no other questions.

## 2021-03-09 NOTE — Telephone Encounter (Signed)
Called the patient back. Per Dr Epimenio Foot he would recommend the patient complete a steroid dose pack. He states she can also increase the tizanidine to TID as needed.  There was no answer and VM was full.   **IF pt returns call please advise the patient he is sending in medication for her to [facility] [address] [place]. The tizanidine she already should have and she can take it up to three times a day.
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
## 2021-03-09 NOTE — Telephone Encounter (Signed)
Tried a 2nd time to call the patient and she still didn't answer

## 2021-03-09 NOTE — Addendum Note (Signed)
Addended by: Judi Cong on: 03/09/2021 01:49 PM   Modules accepted: Orders

## 2021-03-14 ENCOUNTER — Encounter: Payer: Self-pay | Admitting: Neurology

## 2021-03-14 ENCOUNTER — Ambulatory Visit: Payer: Medicare PPO | Admitting: Neurology

## 2021-03-14 VITALS — BP 124/77 | HR 82 | Ht 62.0 in

## 2021-03-14 DIAGNOSIS — S24103S Unspecified injury at T7-T10 level of thoracic spinal cord, sequela: Secondary | ICD-10-CM | POA: Diagnosis not present

## 2021-03-14 DIAGNOSIS — R208 Other disturbances of skin sensation: Secondary | ICD-10-CM

## 2021-03-14 DIAGNOSIS — F3181 Bipolar II disorder: Secondary | ICD-10-CM | POA: Diagnosis not present

## 2021-03-14 DIAGNOSIS — N319 Neuromuscular dysfunction of bladder, unspecified: Secondary | ICD-10-CM | POA: Diagnosis not present

## 2021-03-14 DIAGNOSIS — K592 Neurogenic bowel, not elsewhere classified: Secondary | ICD-10-CM

## 2021-03-14 DIAGNOSIS — G8220 Paraplegia, unspecified: Secondary | ICD-10-CM

## 2021-03-14 DIAGNOSIS — F418 Other specified anxiety disorders: Secondary | ICD-10-CM

## 2021-03-14 MED ORDER — LAMOTRIGINE 200 MG PO TABS: 200.0000 mg | ORAL_TABLET | Freq: Two times a day (BID) | ORAL | 3 refills | Status: AC

## 2021-03-14 MED ORDER — TIZANIDINE HCL 4 MG PO TABS: 4.0000 mg | ORAL_TABLET | Freq: Three times a day (TID) | ORAL | 3 refills | Status: DC
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
## 2021-04-17 ENCOUNTER — Telehealth: Payer: Self-pay | Admitting: Neurology

## 2021-04-17 DIAGNOSIS — S24103S Unspecified injury at T7-T10 level of thoracic spinal cord, sequela: Secondary | ICD-10-CM

## 2021-04-17 DIAGNOSIS — G8220 Paraplegia, unspecified: Secondary | ICD-10-CM

## 2021-04-17 NOTE — Telephone Encounter (Signed)
Pt would like a call from the nurse to discuss continuing problems with wheelchair. Have called [employer], have not gotten a response. Having some problems with my back because of my wheelchair.

## 2021-04-17 NOTE — Telephone Encounter (Addendum)
Tried calling [employer] at [phone]. They are closed for [place].  I called pt back. Dr Anne Hahn sent her to [facility] back on 10/10/20. They sent her back to [employer] to get new chair but has not heard anything. Her current electric wheelchair has stopped in middle of living room, charged but not moving. She will f/u with [facility] about referral/let them know she has not heard anything. She will call back if anything else is needed from our office.   She also reports ongoing back pain. She has large gab in middle of back when she lays down on bed. She has queen size hospital bed. Unable to prop pillow between. States this causes increased pain. She is wanting to know if there is a particular wedge Dr. Epimenio Foot recommends for her that she can get from DME?   She also does not feel a lot of benefit from tizanidine 4mg  po TID. Wanting to know if dose can be increased or change to another medication?   Aware I will send to MD to review and call her back latest tomorrow.

## 2021-04-18 ENCOUNTER — Ambulatory Visit: Payer: Medicare PPO | Admitting: Neurology

## 2021-04-18 MED ORDER — MELOXICAM 7.5 MG PO TABS: 7.5000 mg | ORAL_TABLET | Freq: Every day | ORAL | 5 refills | Status: DC

## 2021-04-18 NOTE — Telephone Encounter (Signed)
She called [facility] yesterday and they told her they will get back with her about [employer] referral. She herself has called several times and could not get in touch with [employer]. She wants to go with [facility] at this point d/t issues with [employer]. Aware we will placed referral. I sent community message to Ashley Gentry that order placed.  E-scribed rx meloxicam to :[id number] - [place], [place] - [address] AT [address]

## 2021-04-18 NOTE — Addendum Note (Signed)
Addended by: Wyvonnia Lora on: 04/18/2021 03:46 PM   Modules accepted: Orders

## 2021-04-18 NOTE — Telephone Encounter (Signed)
Attempted to call the pt. There was no answer and no VM set up. Will try the patient again later.  If pt returns call, please advise that the nurse spoke with Dr Epimenio Foot about her concerns.  Dr Epimenio Foot didn't have a recommendation for any particular wedge. Therapy may be able to guide her on that. Please let her know that Dr Epimenio Foot would be willing to prescribe a medication for her called meloxicam (mobic) this is a stronger prescription anti-inflammatory, she would take 1 a day. If pt agrees, please confirm what pharmacy to send the script to.  Per Dr Epimenio Foot: "I do not really have any recommendation for the wedge. .  We can add meloxicam 7.5 mg 1 p.o. daily #30 with 5 refills for pain."

## 2021-04-19 ENCOUNTER — Telehealth: Payer: Self-pay | Admitting: Neurology

## 2021-04-19 NOTE — Telephone Encounter (Signed)
Sent Home health referral to [facility] with [facility]. He will let me know if he can take the patient.

## 2021-04-19 NOTE — Telephone Encounter (Addendum)
Received the following message back from [place]: "Unfortunately, we are unable to assist patient with Power wheelchair since she received her current chair from [employer] and isn't eligible for a replacement power wheelchair for 5 years from the delivery date of the current chair. We spoke to patient in November and suggested she contact her insurance company and they should have an advocate who can assist her with the calls to [employer]. Once she has had the current chair for 5 years we will be happy to assist patient in pursuing a new chair. Thank you for reaching out and I wish we could assist more."  I called [employer] at  [phone] [facility] location) and spoke Jacqlyn Larsen to try and get update on pt. Updated pt address and confirmed phone#. Advised pt had TOC to Dr. Felecia Shelling from Dr Jannifer Franklin since he retired. Confirmed our office address, phone# and fax# [phone]). She will reach out to pt herself to get things in motion to try and repair current wheelchair. She will call our office back if she is not able to reach the patient.  Becky called back. Went to pt VM and unable to leave message. Advised I will call pt. She will also try to call pt again this afternoon. Pt can call [employer] back at [phone]. Need to tell them she is calling to get repair order started. I called pt. Got pt VM as well, VM not set up, unable to leave message.

## 2021-04-19 NOTE — Telephone Encounter (Signed)
Marjory Lies messaged me back informing me he can take the patient, but he was wanting to know what kind of nursing that is needed.

## 2021-04-19 NOTE — Telephone Encounter (Signed)
We can cancel this request. We have it worked out. She is going to go via [employer]. [facility] could not take her. Nursing was only added d/t needing two services to go out for [facility] referral.

## 2021-04-19 NOTE — Telephone Encounter (Signed)
Called and relayed message per Kara Mead. Provided [employer] phone number and informed pt to keep trying to reach out. At this moment she has to speak directly to [employer] for her concerns. Pt verbalized understanding and had no further questions at this time.

## 2021-04-19 NOTE — Telephone Encounter (Signed)
Pr returned phone call , Informed pt of previous Telephone note. Pt said she had already called [employer] and person helping her could not leave her to get a pen to write down the phone number for [employer]. Would like a call back.

## 2021-04-20 NOTE — Telephone Encounter (Signed)
Noted, I sent Marjory Lies with [facility] a message informing him we no longer need this referral at this time.

## 2021-05-18 ENCOUNTER — Telehealth: Payer: Self-pay | Admitting: Neurology

## 2021-05-18 MED ORDER — PREGABALIN 200 MG PO CAPS: 200.0000 mg | ORAL_CAPSULE | Freq: Two times a day (BID) | ORAL | 5 refills | Status: DC

## 2021-05-18 NOTE — Telephone Encounter (Signed)
Dr. Epimenio Foot- pt misplaced medication back on 02/22/21 at which time we allowed early refill (it was a couple weeks early at that time). I checked drug registry. Showing she last refilled 02/22/21 #180 (90days supply). She is due for refill on or after 05/23/21.  Are you ok with providing early refill for 7 days supply?

## 2021-05-18 NOTE — Telephone Encounter (Signed)
Dr. Epimenio Foot- please review. If you approve refill, I have it ready for you to sign off on in this encounter. Her next appt is 07/18/21.  Called pt. She reports she is not wanting 7 day refill, just wants early refill. Reminded her that this happened back in November as well. Cannot continue to refill early if we start seeing a trend like this d/t med being controlled. MD will need to make call on whether he approves. She realized yesterday that she was out of Lyrica. She is unsure why she is short on medication. She is concerned about nausea she has off of med, takes prn med for nausea.  She is thinking she may have thrown out med bu mistake if they fell on ground and someone cleaned up.   I recommended that moving forward, she will need to get a closer eye on medication, lock up if needed. She has had various care takers since she moved into her apartment. Does not feel anyone is tampering w/ med but she will keep med in original container and would like 30 days supply instead of 90 if MD approved. She will make sure to take as prescribed: 1 po BID.

## 2021-05-18 NOTE — Telephone Encounter (Signed)
Pt states her care takers can not find [id number] (LYRICA) 200 MG capsule Pt has spoken with pharmacy and they are telling her that they need to speak with provider or RN re: a need for a 7 day early fill, please call [place] [phone]

## 2021-05-18 NOTE — Telephone Encounter (Signed)
Pt ask if there is a substitution due to the nausea. Would like a call from the nurse.

## 2021-05-19 NOTE — Telephone Encounter (Addendum)
Tried to call the pharmacy. Waited 10 minutes, was able to speak with pharmacist. They said everything was straightened out. The hold up was with insurance, not our office.

## 2021-05-19 NOTE — Telephone Encounter (Signed)
Pt called  Pharmacy requiring a phone call from the physician for early request for pregabalin (LYRICA) 200 MG capsule. Having severe nausea, dizzy can not wait until next week   [place] #[id number]  Phone: [phone]

## 2021-05-19 NOTE — Telephone Encounter (Signed)
Pt said before pharmacy need approval to do early request from Dr. Felecia Shelling.

## 2021-05-22 ENCOUNTER — Encounter: Payer: Medicare PPO | Admitting: Physical Medicine and Rehabilitation

## 2021-05-30 ENCOUNTER — Telehealth: Payer: Self-pay | Admitting: Neurology

## 2021-05-30 NOTE — Telephone Encounter (Signed)
Called pt back. She feels she has had a UTI for awhile now that she has been trying to manage from home. I brought up that I see that she has seen urology in the past. She confirmed, she has been to [facility] in the past year. I recommended she f/u with them for eval/treatment.   She does not have current PCP either. I recommended she call insurance to get a list of in network PCP's in her area to get established with ASAP.

## 2021-05-30 NOTE — Telephone Encounter (Signed)
Pt asking if a caregiver can bring a sample of her urine for a urinalysis for an infection, believe have a UTI. Would like a call from the nurse.  Informed Pt to see her PCP. Pt insisted on sending a message to the nurse.

## 2021-06-15 ENCOUNTER — Telehealth: Payer: Self-pay | Admitting: Neurology

## 2021-06-15 NOTE — Telephone Encounter (Signed)
Called the patient and made her aware that the script was sent in feb with 5 refills to that pharmacy. According to the drug registry it looks like they have those refills available for her on file. Pt will call the pharmacy ?

## 2021-06-15 NOTE — Telephone Encounter (Signed)
Pt is needing a refill request for her pregabalin (LYRICA) 200 MG capsule sent in to the [facility] on [address]  ?

## 2021-06-26 ENCOUNTER — Other Ambulatory Visit: Payer: Self-pay | Admitting: Urology

## 2021-07-10 ENCOUNTER — Telehealth: Payer: Self-pay | Admitting: Neurology

## 2021-07-10 DIAGNOSIS — R202 Paresthesia of skin: Secondary | ICD-10-CM

## 2021-07-10 DIAGNOSIS — R208 Other disturbances of skin sensation: Secondary | ICD-10-CM

## 2021-07-10 DIAGNOSIS — G959 Disease of spinal cord, unspecified: Secondary | ICD-10-CM

## 2021-07-10 DIAGNOSIS — S24103S Unspecified injury at T7-T10 level of thoracic spinal cord, sequela: Secondary | ICD-10-CM

## 2021-07-10 NOTE — Telephone Encounter (Signed)
Called pt back. Relayed Dr. Bonnita Hollow message. She will continue to monitor sx and keep appt for next week. If sx worsen in next couple days at all, she will call back. ?

## 2021-07-10 NOTE — Telephone Encounter (Signed)
Pt said today paralyzed from mid nipple up above breast, numbness in left hand and switching in both hands. Would like a call from the nurse. ?

## 2021-07-10 NOTE — Telephone Encounter (Signed)
Called pt. She reports last night she went to pick up sheet to lift over her. She turned head to the left quickly which caused  numbing sensation near nipple line/hot sensation. Cannot feel top of breast, fingers twitching in both hands since this episode. She was holding item, could not feel fingertips but hands twitching today. Worried about increased paralysis. Numbness going above nipple line new for her. ? ?She has appt next week with Dr. Epimenio Foot on 07/18/21 at 2:30pm.  ?Offered to schedule appt this week but she declined. Would like to keep appt next week d/t having to set up transportation. ? ?Aware I will send to Dr. Epimenio Foot to review and will call if he wants to do anything prior to appt next week. ? ? ? ?

## 2021-07-10 NOTE — Telephone Encounter (Signed)
Tried calling pt back. Mailbox full, unable to LVM ?

## 2021-07-11 NOTE — Addendum Note (Signed)
Addended byYetta Barre, Kara Mead L on: 07/11/2021 04:00 PM ? ? Modules accepted: Orders ? ?

## 2021-07-11 NOTE — Telephone Encounter (Signed)
Called pt. Having same sx as yesterday but worse. Transferring to shower chair worsened sx. She would like to get urgent MRI's completed that Dr. Epimenio Foot mentioned. Would like it done at [facility]. I placed orders (Dr. Epimenio Foot requested MRI cervical/thoracic w/ w/o). Pt aware to proceed to ER if sx become severe before she can get MRI's completed/come for appt next week. ?

## 2021-07-11 NOTE — Telephone Encounter (Signed)
Pt is experiences worsening symptoms with issue that happened 2 days ago. Pt requesting call back and says it is urgent. ?

## 2021-07-12 ENCOUNTER — Telehealth: Payer: Self-pay | Admitting: Neurology

## 2021-07-12 NOTE — Telephone Encounter (Addendum)
Called pt. Advised I spoke with Dr. Epimenio Foot who would like her to proceed to ER for urgent work up. She verbalized understanding. She will do this. She will keep scheduled appt next week. ?

## 2021-07-12 NOTE — Telephone Encounter (Signed)
FYI ? ? ?I called the scheduling line for the hospital to scheduler her MRI. I spoke with [name] and informed her that I needed to schedule an urgent MRI and the patient is requesting [facility]. ? ?She called [facility] they informed her they are already double booked and are not able to do it. She called [facility] but no one picked up she said she will keep on trying to call [facility]. She said if [facility] is not able to do it and it is truly urgent the patient will have to go to the ER. ? ?Right now I am waiting to hear back from [facility].  ? Craig Staggers: [id number] (exp. 07/12/21 to 08/11/21) ?

## 2021-07-12 NOTE — Telephone Encounter (Signed)
Took call from phone staff and spoke with pt. She reports left side of stomach feels heavy/more droopy. Wondering if MRI thoracic would should cause. Advised it would look more at spine/not stomach. She should proceed to ER today and relay all the sx she is having so that they can do through exam/work up. She verbalized understanding.  ?

## 2021-07-12 NOTE — Telephone Encounter (Signed)
[name] called me back and informed me Ashley Gentry does not have anything either.. they are suggesting if it is that bad for the patient to go to the ER.  ?

## 2021-07-13 ENCOUNTER — Encounter (HOSPITAL_COMMUNITY): Payer: Self-pay | Admitting: Emergency Medicine

## 2021-07-13 ENCOUNTER — Emergency Department (HOSPITAL_COMMUNITY): Payer: Medicare PPO

## 2021-07-13 ENCOUNTER — Other Ambulatory Visit: Payer: Self-pay

## 2021-07-13 ENCOUNTER — Emergency Department (HOSPITAL_COMMUNITY)
Admission: EM | Admit: 2021-07-13 | Discharge: 2021-07-14 | Disposition: A | Discharge status: Home / Self Care | Payer: Medicare PPO | Attending: Emergency Medicine | Admitting: Emergency Medicine

## 2021-07-13 DIAGNOSIS — M549 Dorsalgia, unspecified: Secondary | ICD-10-CM | POA: Insufficient documentation

## 2021-07-13 DIAGNOSIS — M79605 Pain in left leg: Secondary | ICD-10-CM | POA: Diagnosis not present

## 2021-07-13 DIAGNOSIS — R200 Anesthesia of skin: Secondary | ICD-10-CM | POA: Diagnosis not present

## 2021-07-13 DIAGNOSIS — R0602 Shortness of breath: Secondary | ICD-10-CM | POA: Insufficient documentation

## 2021-07-13 DIAGNOSIS — R062 Wheezing: Secondary | ICD-10-CM | POA: Insufficient documentation

## 2021-07-13 DIAGNOSIS — M79604 Pain in right leg: Secondary | ICD-10-CM | POA: Insufficient documentation

## 2021-07-13 DIAGNOSIS — R110 Nausea: Secondary | ICD-10-CM | POA: Insufficient documentation

## 2021-07-13 DIAGNOSIS — R140 Abdominal distension (gaseous): Secondary | ICD-10-CM | POA: Insufficient documentation

## 2021-07-13 DIAGNOSIS — R531 Weakness: Secondary | ICD-10-CM | POA: Diagnosis not present

## 2021-07-13 DIAGNOSIS — E876 Hypokalemia: Secondary | ICD-10-CM | POA: Diagnosis not present

## 2021-07-13 LAB — BASIC METABOLIC PANEL
Anion gap: 7 (ref 5–15)
BUN: 9 mg/dL (ref 6–20)
CO2: 28 mmol/L (ref 22–32)
Calcium: 9.4 mg/dL (ref 8.9–10.3)
Chloride: 103 mmol/L (ref 98–111)
Creatinine, Ser: 0.45 mg/dL (ref 0.44–1.00)
GFR, Estimated: >60 mL/min (ref >60)
Glucose, Bld: 113 mg/dL — ABNORMAL HIGH (ref 70–99)
Potassium: 3.2 mmol/L — ABNORMAL LOW (ref 3.5–5.1)
Sodium: 138 mmol/L (ref 135–145)

## 2021-07-13 LAB — CBC
HCT: 42.7 % (ref 36.0–46.0)
Hemoglobin: 13.8 g/dL (ref 12.0–15.0)
MCH: 30.5 pg (ref 26.0–34.0)
MCHC: 32.3 g/dL (ref 30.0–36.0)
MCV: 94.3 fL (ref 80.0–100.0)
Platelets: 291 10*3/uL (ref 150–400)
RBC: 4.53 MIL/uL (ref 3.87–5.11)
RDW: 14.6 % (ref 11.5–15.5)
WBC: 9.7 10*3/uL (ref 4.0–10.5)
nRBC: 0 % (ref 0.0–0.2)

## 2021-07-13 MED ORDER — BACLOFEN 10 MG PO TABS
10.0000 mg | ORAL_TABLET | Freq: Three times a day (TID) | ORAL | Status: DC | PRN
  Administered 2021-07-13: 10 mg via ORAL
  Filled 2021-07-13: qty 1

## 2021-07-13 MED ORDER — GADOBUTROL 1 MMOL/ML IV SOLN
8.0000 mL | Freq: Once | INTRAVENOUS | Status: AC | PRN
  Administered 2021-07-13: 8 mL via INTRAVENOUS

## 2021-07-13 MED ORDER — POTASSIUM CHLORIDE CRYS ER 20 MEQ PO TBCR
30.0000 meq | EXTENDED_RELEASE_TABLET | Freq: Once | ORAL | Status: AC
  Administered 2021-07-13: 30 meq via ORAL
  Filled 2021-07-13: qty 1

## 2021-07-13 MED ORDER — IOHEXOL 300 MG/ML  SOLN
100.0000 mL | Freq: Once | INTRAMUSCULAR | Status: AC | PRN
  Administered 2021-07-13: 100 mL via INTRAVENOUS

## 2021-07-13 MED ORDER — ALBUTEROL SULFATE HFA 108 (90 BASE) MCG/ACT IN AERS
2.0000 | INHALATION_SPRAY | Freq: Once | RESPIRATORY_TRACT | Status: AC
  Administered 2021-07-13: 2 via RESPIRATORY_TRACT
  Filled 2021-07-13: qty 6.7

## 2021-07-13 MED ORDER — PREGABALIN 100 MG PO CAPS
200.0000 mg | ORAL_CAPSULE | Freq: Three times a day (TID) | ORAL | Status: DC | PRN
  Administered 2021-07-13: 200 mg via ORAL
  Filled 2021-07-13: qty 2

## 2021-07-13 MED ORDER — SODIUM CHLORIDE 0.9 % IV BOLUS
500.0000 mL | Freq: Once | INTRAVENOUS | Status: AC
Start: 2021-07-13 — End: 2021-07-13
  Administered 2021-07-13: 500 mL via INTRAVENOUS

## 2021-07-13 MED ORDER — ONDANSETRON HCL 4 MG/2ML IJ SOLN
4.0000 mg | Freq: Once | INTRAMUSCULAR | Status: AC
  Administered 2021-07-13: 4 mg via INTRAVENOUS
  Filled 2021-07-13: qty 2

## 2021-07-13 MED ORDER — OXYCODONE-ACETAMINOPHEN 5-325 MG PO TABS
1.0000 | ORAL_TABLET | Freq: Four times a day (QID) | ORAL | Status: DC | PRN
  Administered 2021-07-13: 1 via ORAL
  Filled 2021-07-13: qty 1

## 2021-07-13 MED ORDER — TIZANIDINE HCL 4 MG PO TABS
4.0000 mg | ORAL_TABLET | Freq: Three times a day (TID) | ORAL | Status: DC | PRN
  Administered 2021-07-13: 4 mg via ORAL
  Filled 2021-07-13: qty 1
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
## 2021-07-13 NOTE — ED Notes (Signed)
Pt remains in MRI at this time. VS to be updated upon pts return.  ?
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
## 2021-07-13 NOTE — ED Notes (Signed)
PTAR called pt has 6 people infront of her ?

## 2021-07-13 NOTE — Discharge Instructions (Addendum)
Follow-up with your primary care provider in 1 to 2 days. ? ?You have been evaluated for numbness and abdominAL bloating.  You are able to see your review your labs and imaging finding on my [facility]. ? ?Please return back to the emergency department for worsening symptoms.  If you have new fever.  Or if you feel like you need to be reevaluated. ?

## 2021-07-14 MED ORDER — ONDANSETRON 4 MG PO TBDP
4.0000 mg | ORAL_TABLET | Freq: Once | ORAL | Status: AC
  Administered 2021-07-14: 4 mg via ORAL
  Filled 2021-07-14: qty 1

## 2021-07-14 NOTE — ED Notes (Signed)
Patient complaining of nausea at this time.  Dr notified.  New orders per EDP.   ?

## 2021-07-18 ENCOUNTER — Ambulatory Visit: Payer: Medicare PPO | Admitting: Neurology

## 2021-07-18 ENCOUNTER — Encounter: Payer: Self-pay | Admitting: Neurology

## 2021-07-18 VITALS — BP 143/98 | HR 104 | Ht 62.0 in

## 2021-07-18 DIAGNOSIS — R252 Cramp and spasm: Secondary | ICD-10-CM

## 2021-07-18 DIAGNOSIS — G894 Chronic pain syndrome: Secondary | ICD-10-CM

## 2021-07-18 DIAGNOSIS — N319 Neuromuscular dysfunction of bladder, unspecified: Secondary | ICD-10-CM | POA: Diagnosis not present

## 2021-07-18 DIAGNOSIS — G8220 Paraplegia, unspecified: Secondary | ICD-10-CM

## 2021-07-18 DIAGNOSIS — K592 Neurogenic bowel, not elsewhere classified: Secondary | ICD-10-CM | POA: Diagnosis not present

## 2021-07-18 DIAGNOSIS — S24103S Unspecified injury at T7-T10 level of thoracic spinal cord, sequela: Secondary | ICD-10-CM | POA: Diagnosis not present

## 2021-07-18 MED ORDER — PREGABALIN 200 MG PO CAPS: 200.0000 mg | ORAL_CAPSULE | Freq: Three times a day (TID) | ORAL | 11 refills | Status: DC | PRN

## 2021-07-18 MED ORDER — BACLOFEN 10 MG PO TABS: ORAL_TABLET | ORAL | 5 refills | Status: DC

## 2021-07-18 MED ORDER — LIDOCAINE 5 % EX OINT: 1.0000 "application " | TOPICAL_OINTMENT | CUTANEOUS | 0 refills | Status: AC | PRN
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
## 2021-07-19 ENCOUNTER — Telehealth: Payer: Self-pay | Admitting: *Deleted

## 2021-07-19 NOTE — Telephone Encounter (Signed)
Submitted PA on CMM. Key: [id number]. Received instant approval: PA Case: [id number], Status: Approved, Coverage Starts on: 04/02/2021 12:00:00 AM, Coverage Ends on: 04/01/2022 12:00:00 AM. Questions? Contact [phone]. ? ?

## 2021-07-24 ENCOUNTER — Encounter: Payer: Self-pay | Admitting: *Deleted

## 2021-07-24 ENCOUNTER — Telehealth: Payer: Self-pay | Admitting: Neurology

## 2021-07-24 NOTE — Telephone Encounter (Addendum)
Called and spoke w/ pt. Her dogs name is Neeko . He is a Actuary and currently in training.  Doing local service training right now. They are not w/ service dog training. It is a private company that helps train animals for individuals w/ disabilities.  ? ?I asked if he is a Tax adviser. She states he is not. She went to [facility] back in 2021. They set her up with facility that has two year program to get service animal. She did not complete this. Reason she did not complete-she was unable to go to [facility] for 2 wks d/t not having caregiver.   ? ?Her apartment complex up until now has made exception for her to keep dog. However, her lease is up and they are reviewing her case.  Apartment complex requesting letter for documentation. She wants general letter stating she has condition that requires her to have dog. She does not feel she has a lot of support. Wants to feel more independent, dog allows her to do this.  ? ?Things her dog is learning via training: sound/tone touches like opening door, help her pick up dropped items, alert her to changes in BP, etc. Getting him to contact EMS in cases of an emergency. Helps alert her if someone is at the door. ? ?Rolene Arbour is also working with her to get medical equipment for the home. ? ?Would like letter emailed to her to: [email or web address] ?

## 2021-07-24 NOTE — Telephone Encounter (Signed)
Released letter to [facility] and sent letter via email to pt. ?

## 2021-07-24 NOTE — Telephone Encounter (Signed)
Patient would like Dr. Epimenio Foot to give her a letter stating it is necessary she has her dog and that the dog has been in the office multiple times and is very well behaved. She sid the Dr. Nedra Hai who referred her here had a letter on file for her and she wanted to have one from Dr. Epimenio Foot. She also wanted to give the letter to her housing complex so they know the importance that is is necessary and that he is well behaved. If you can call her back and send her something through mychart she would appreciate it.  ?

## 2021-07-25 ENCOUNTER — Encounter: Payer: Self-pay | Admitting: Neurology

## 2021-09-04 ENCOUNTER — Telehealth: Payer: Self-pay | Admitting: Neurology

## 2021-09-04 MED ORDER — PREGABALIN 200 MG PO CAPS: 200.0000 mg | ORAL_CAPSULE | Freq: Two times a day (BID) | ORAL | 0 refills | Status: DC

## 2021-09-04 NOTE — Telephone Encounter (Signed)
Pt last seen 07/18/21 and next f/u 02/08/22. Checked drug registry. She last refilled Lyrica 07/18/21 #90.  Rx last sent to [id number] - [place], [place] - [address]  07/18/21 #90, 11 refills.   I called pt. She is at [facility] currently. Admitted 07/28/21 for spinal cord treatment. Has post op f/u after catheter surgery for urinary issues on 09/06/21. Should be discharged after this. Has medical transportation to get home.  Would like 30 days supply of Lyrica sent to pharmacy near her. Aware I will send request to Dr. Epimenio Foot. She verbalized understanding.

## 2021-09-04 NOTE — Telephone Encounter (Signed)
Pt asking one time refill for  pregabalin (LYRICA) 200 MG capsule be sent to [facility]  [address] [place],  [place] [address] Phone [phone]  There for spinal cord treatment at the [facility] and need refill sent. Would like a call back to confirm has be sent, do not have any medication left.

## 2021-09-05 ENCOUNTER — Ambulatory Visit: Admit: 2021-09-05 | Payer: Medicare PPO | Admitting: Urology

## 2021-09-05 SURGERY — BOTOX INJECTION
Anesthesia: General

## 2021-11-03 ENCOUNTER — Telehealth: Payer: Self-pay | Admitting: Emergency Medicine

## 2021-11-03 ENCOUNTER — Ambulatory Visit: Admit: 2021-11-03 | Discharge status: Absent without leave | Payer: Medicare PPO

## 2021-11-03 NOTE — Telephone Encounter (Signed)
Reason for visit outside the scope of Urgent Care - RN reviewed chart with provider - pt directed to ED. Pt verbalized an understanding  & thanked RN for the call. Pt's s/o with pt in car

## 2021-11-14 ENCOUNTER — Telehealth: Payer: Self-pay | Admitting: Neurology

## 2021-11-14 NOTE — Telephone Encounter (Signed)
Called the patient and there was no answer. No VM set up.  **If pt returns call there is a 9 am on hold 11/15/21 and a 2p on hold 8/17. Please offer one of those to the patient

## 2021-11-14 NOTE — Telephone Encounter (Signed)
Pt is calling and need a  sooner appointment  with Dr. Epimenio Foot because she needs his help getting a safer wheelchair. Pt is requesting a nurse give her a call to discuss this issues.

## 2021-11-14 NOTE — Telephone Encounter (Signed)
Called the pt and was able to get the patient worked in Monday at 9 am. Check in of 8:30 am. Pt verbalized understanding.

## 2021-11-14 NOTE — Telephone Encounter (Signed)
..   Pt understands that although there may be some limitations with this type of visit, we will take all precautions to reduce any security or privacy concerns.  Pt understands that this will be treated like an in office visit and we will file with pt's insurance, and there may be a patient responsible charge related to this service. ? ?

## 2021-11-20 ENCOUNTER — Encounter: Payer: Self-pay | Admitting: Physical Medicine and Rehabilitation

## 2021-11-20 ENCOUNTER — Encounter: Payer: Self-pay | Admitting: Neurology

## 2021-11-20 ENCOUNTER — Telehealth (INDEPENDENT_AMBULATORY_CARE_PROVIDER_SITE_OTHER): Payer: Medicare PPO | Admitting: Neurology

## 2021-11-20 ENCOUNTER — Encounter: Payer: Self-pay | Admitting: Physical Medicine & Rehabilitation

## 2021-11-20 DIAGNOSIS — N319 Neuromuscular dysfunction of bladder, unspecified: Secondary | ICD-10-CM

## 2021-11-20 DIAGNOSIS — K592 Neurogenic bowel, not elsewhere classified: Secondary | ICD-10-CM

## 2021-11-20 DIAGNOSIS — G8220 Paraplegia, unspecified: Secondary | ICD-10-CM | POA: Diagnosis not present

## 2021-11-20 DIAGNOSIS — S24103S Unspecified injury at T7-T10 level of thoracic spinal cord, sequela: Secondary | ICD-10-CM | POA: Diagnosis not present

## 2021-11-20 DIAGNOSIS — G894 Chronic pain syndrome: Secondary | ICD-10-CM

## 2021-11-20 DIAGNOSIS — R208 Other disturbances of skin sensation: Secondary | ICD-10-CM | POA: Insufficient documentation
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
## 2021-11-21 ENCOUNTER — Telehealth: Payer: Self-pay | Admitting: Neurology

## 2021-11-21 NOTE — Telephone Encounter (Signed)
Called to schedule 6 month f/u visit with Dr. Epimenio Foot, but pt's VM box full. Sent mychart msg asking pt to call back to schedule f/u

## 2022-01-15 ENCOUNTER — Other Ambulatory Visit (HOSPITAL_COMMUNITY): Payer: Self-pay

## 2022-01-15 MED ORDER — WEGOVY 0.25 MG/0.5ML ~~LOC~~ SOAJ
0.2500 mg | SUBCUTANEOUS | 9 refills | Status: AC
  Filled 2022-01-15 – 2022-04-18 (×4): qty 2, 28d supply, fill #0

## 2022-01-16 ENCOUNTER — Other Ambulatory Visit: Payer: Self-pay | Admitting: Neurology

## 2022-01-16 ENCOUNTER — Telehealth: Payer: Self-pay | Admitting: Neurology

## 2022-01-16 ENCOUNTER — Other Ambulatory Visit: Payer: Self-pay

## 2022-01-16 MED ORDER — PREGABALIN 200 MG PO CAPS: 200.0000 mg | ORAL_CAPSULE | Freq: Three times a day (TID) | ORAL | 5 refills | Status: DC | PRN

## 2022-01-16 NOTE — Telephone Encounter (Signed)
Faxed printed/signed rx Lyrica to [facility] at [phone]. Received fax confirmation.

## 2022-01-16 NOTE — Telephone Encounter (Signed)
Called pt back. She asked about Lyrica refill. Advised rx printed, waiting on MD signature and then will fax in to her pharmacy. She verbalized understanding.   She also asking about supplies that she needs (catheter/briefs). She is going to contact [employer] to fax order to Dr. Felecia Shelling at [phone].

## 2022-01-16 NOTE — Telephone Encounter (Signed)
Pt would like a call from the nurse to discuss refill for pregabalin (LYRICA) 200 MG capsule.   Refill Also would like to discuss who use last for equipment last time.

## 2022-02-08 ENCOUNTER — Encounter: Payer: Self-pay | Admitting: Neurology

## 2022-02-08 ENCOUNTER — Ambulatory Visit: Payer: Medicare PPO | Admitting: Neurology

## 2022-02-26 ENCOUNTER — Other Ambulatory Visit (HOSPITAL_COMMUNITY): Payer: Self-pay

## 2022-03-02 ENCOUNTER — Other Ambulatory Visit (HOSPITAL_BASED_OUTPATIENT_CLINIC_OR_DEPARTMENT_OTHER): Payer: Self-pay

## 2022-03-05 ENCOUNTER — Other Ambulatory Visit (HOSPITAL_BASED_OUTPATIENT_CLINIC_OR_DEPARTMENT_OTHER): Payer: Self-pay

## 2022-03-09 ENCOUNTER — Other Ambulatory Visit (HOSPITAL_BASED_OUTPATIENT_CLINIC_OR_DEPARTMENT_OTHER): Payer: Self-pay

## 2022-03-19 ENCOUNTER — Ambulatory Visit: Payer: Medicare PPO | Admitting: Physical Medicine and Rehabilitation

## 2022-03-21 ENCOUNTER — Other Ambulatory Visit: Payer: Self-pay | Admitting: Urology

## 2022-03-22 ENCOUNTER — Other Ambulatory Visit (HOSPITAL_BASED_OUTPATIENT_CLINIC_OR_DEPARTMENT_OTHER): Payer: Self-pay

## 2022-03-23 ENCOUNTER — Other Ambulatory Visit (HOSPITAL_COMMUNITY): Payer: Self-pay | Admitting: Gastroenterology

## 2022-03-23 DIAGNOSIS — R1084 Generalized abdominal pain: Secondary | ICD-10-CM

## 2022-04-05 ENCOUNTER — Encounter (HOSPITAL_COMMUNITY): Payer: Self-pay | Admitting: Urology

## 2022-04-05 ENCOUNTER — Other Ambulatory Visit: Payer: Self-pay

## 2022-04-05 NOTE — Progress Notes (Signed)
For Short Stay: Blain appointment date:  Bowel Prep reminder:   For Anesthesia: PCP - Dr. Isabell Jarvis. Cardiologist - N/A  Chest x-ray -  EKG -  Stress Test -  ECHO -  Cardiac Cath -  Pacemaker/ICD device last checked: Pacemaker orders received: Device Rep notified:  Spinal Cord Stimulator:  Sleep Study -  CPAP -   Fasting Blood Sugar -  Checks Blood Sugar _____ times a day Date and result of last Hgb A1c-  Last dose of GLP1 agonist- semaglutide: Pt. Has not started to take it yet. GLP1 instructions:   Last dose of SGLT-2 inhibitors-  SGLT-2 instructions:   Blood Thinner Instructions: Aspirin Instructions: Last Dose:  Activity level: Can go up a flight of stairs and activities of daily living without stopping and without chest pain and/or shortness of breath   Able to exercise without chest pain and/or shortness of breath   Unable to go up a flight of stairs without chest pain and/or shortness of breath     Anesthesia review: Hx: PE,DVT  Patient denies shortness of breath, fever, cough and chest pain at PAT appointment   Patient verbalized understanding of instructions that were given to them at the PAT appointment. Patient was also instructed that they will need to review over the PAT instructions again at home before surgery.

## 2022-04-06 ENCOUNTER — Other Ambulatory Visit (HOSPITAL_BASED_OUTPATIENT_CLINIC_OR_DEPARTMENT_OTHER): Payer: Self-pay

## 2022-04-09 ENCOUNTER — Ambulatory Visit (HOSPITAL_BASED_OUTPATIENT_CLINIC_OR_DEPARTMENT_OTHER): Admission: RE | Admit: 2022-04-09 | Payer: Medicare PPO | Source: Ambulatory Visit

## 2022-04-13 ENCOUNTER — Ambulatory Visit (HOSPITAL_BASED_OUTPATIENT_CLINIC_OR_DEPARTMENT_OTHER): Admission: RE | Admit: 2022-04-13 | Payer: Medicare PPO | Source: Ambulatory Visit

## 2022-04-13 ENCOUNTER — Encounter (HOSPITAL_BASED_OUTPATIENT_CLINIC_OR_DEPARTMENT_OTHER): Payer: Self-pay

## 2022-04-14 ENCOUNTER — Other Ambulatory Visit (HOSPITAL_BASED_OUTPATIENT_CLINIC_OR_DEPARTMENT_OTHER): Payer: Self-pay

## 2022-04-16 ENCOUNTER — Other Ambulatory Visit (HOSPITAL_BASED_OUTPATIENT_CLINIC_OR_DEPARTMENT_OTHER): Payer: Self-pay

## 2022-04-17 ENCOUNTER — Encounter (HOSPITAL_COMMUNITY)
Admission: RE | Disposition: A | Discharge status: Home / Self Care | Payer: Self-pay | Source: Home / Self Care | Attending: Urology

## 2022-04-17 ENCOUNTER — Encounter (HOSPITAL_COMMUNITY): Payer: Self-pay | Admitting: Urology

## 2022-04-17 ENCOUNTER — Encounter: Payer: Self-pay | Admitting: Neurology

## 2022-04-17 ENCOUNTER — Ambulatory Visit (HOSPITAL_BASED_OUTPATIENT_CLINIC_OR_DEPARTMENT_OTHER): Payer: Medicare PPO | Admitting: Certified Registered Nurse Anesthetist

## 2022-04-17 ENCOUNTER — Ambulatory Visit (HOSPITAL_COMMUNITY)
Admission: RE | Admit: 2022-04-17 | Discharge: 2022-04-17 | Disposition: A | Discharge status: Home / Self Care | Payer: Medicare PPO | Attending: Urology | Admitting: Urology

## 2022-04-17 ENCOUNTER — Ambulatory Visit (HOSPITAL_COMMUNITY): Payer: Medicare PPO | Admitting: Certified Registered Nurse Anesthetist

## 2022-04-17 ENCOUNTER — Ambulatory Visit (HOSPITAL_COMMUNITY): Payer: Medicare PPO

## 2022-04-17 DIAGNOSIS — F418 Other specified anxiety disorders: Secondary | ICD-10-CM | POA: Diagnosis not present

## 2022-04-17 DIAGNOSIS — G8220 Paraplegia, unspecified: Secondary | ICD-10-CM | POA: Insufficient documentation

## 2022-04-17 DIAGNOSIS — Z86718 Personal history of other venous thrombosis and embolism: Secondary | ICD-10-CM | POA: Insufficient documentation

## 2022-04-17 DIAGNOSIS — N319 Neuromuscular dysfunction of bladder, unspecified: Secondary | ICD-10-CM | POA: Diagnosis not present

## 2022-04-17 DIAGNOSIS — I739 Peripheral vascular disease, unspecified: Secondary | ICD-10-CM | POA: Diagnosis not present

## 2022-04-17 DIAGNOSIS — F319 Bipolar disorder, unspecified: Secondary | ICD-10-CM | POA: Diagnosis not present

## 2022-04-17 DIAGNOSIS — I2510 Atherosclerotic heart disease of native coronary artery without angina pectoris: Secondary | ICD-10-CM

## 2022-04-17 HISTORY — PX: BOTOX INJECTION: SHX5754

## 2022-04-17 HISTORY — DX: Peripheral vascular disease, unspecified: I73.9

## 2022-04-17 LAB — BASIC METABOLIC PANEL
Anion gap: 9 (ref 5–15)
BUN: 11 mg/dL (ref 6–20)
CO2: 25 mmol/L (ref 22–32)
Calcium: 10.1 mg/dL (ref 8.9–10.3)
Chloride: 99 mmol/L (ref 98–111)
Creatinine, Ser: 0.47 mg/dL (ref 0.44–1.00)
GFR, Estimated: >60 mL/min (ref >60)
Glucose, Bld: 110 mg/dL — ABNORMAL HIGH (ref 70–99)
Potassium: 3.9 mmol/L (ref 3.5–5.1)
Sodium: 133 mmol/L — ABNORMAL LOW (ref 135–145)

## 2022-04-17 LAB — CBC
HCT: 46.6 % — ABNORMAL HIGH (ref 36.0–46.0)
Hemoglobin: 14.8 g/dL (ref 12.0–15.0)
MCH: 29.4 pg (ref 26.0–34.0)
MCHC: 31.8 g/dL (ref 30.0–36.0)
MCV: 92.5 fL (ref 80.0–100.0)
Platelets: 259 10*3/uL (ref 150–400)
RBC: 5.04 MIL/uL (ref 3.87–5.11)
RDW: 14.8 % (ref 11.5–15.5)
WBC: 8.5 10*3/uL (ref 4.0–10.5)
nRBC: 0 % (ref 0.0–0.2)

## 2022-04-17 LAB — GLUCOSE, CAPILLARY: Glucose-Capillary: 120 mg/dL — ABNORMAL HIGH (ref 70–99)

## 2022-04-17 SURGERY — BOTOX INJECTION
Anesthesia: General

## 2022-04-17 MED ORDER — ONDANSETRON HCL 4 MG/2ML IJ SOLN
INTRAMUSCULAR | Status: DC | PRN
  Administered 2022-04-17: 4 mg via INTRAVENOUS

## 2022-04-17 MED ORDER — CHLORHEXIDINE GLUCONATE 0.12 % MT SOLN
15.0000 mL | Freq: Once | OROMUCOSAL | Status: AC
  Administered 2022-04-17: 15 mL via OROMUCOSAL

## 2022-04-17 MED ORDER — PHENYLEPHRINE 80 MCG/ML (10ML) SYRINGE FOR IV PUSH (FOR BLOOD PRESSURE SUPPORT)
PREFILLED_SYRINGE | INTRAVENOUS | Status: DC | PRN
  Administered 2022-04-17 (×4): 160 ug via INTRAVENOUS

## 2022-04-17 MED ORDER — ORAL CARE MOUTH RINSE: 15.0000 mL | Freq: Once | OROMUCOSAL | Status: AC

## 2022-04-17 MED ORDER — FENTANYL CITRATE PF 50 MCG/ML IJ SOSY: 25.0000 ug | PREFILLED_SYRINGE | INTRAMUSCULAR | Status: DC | PRN

## 2022-04-17 MED ORDER — LIDOCAINE 2% (20 MG/ML) 5 ML SYRINGE
INTRAMUSCULAR | Status: DC | PRN
  Administered 2022-04-17: 60 mg via INTRAVENOUS

## 2022-04-17 MED ORDER — FENTANYL CITRATE PF 50 MCG/ML IJ SOSY
PREFILLED_SYRINGE | INTRAMUSCULAR | Status: AC
  Filled 2022-04-17: qty 1

## 2022-04-17 MED ORDER — MIDAZOLAM HCL 5 MG/5ML IJ SOLN
INTRAMUSCULAR | Status: DC | PRN
  Administered 2022-04-17: 2 mg via INTRAVENOUS

## 2022-04-17 MED ORDER — PROPOFOL 10 MG/ML IV BOLUS
INTRAVENOUS | Status: DC | PRN
  Administered 2022-04-17: 50 mg via INTRAVENOUS
  Administered 2022-04-17: 150 mg via INTRAVENOUS

## 2022-04-17 MED ORDER — PROPOFOL 10 MG/ML IV BOLUS
INTRAVENOUS | Status: AC
  Filled 2022-04-17: qty 20

## 2022-04-17 MED ORDER — ACETAMINOPHEN 500 MG PO TABS
1000.0000 mg | ORAL_TABLET | Freq: Once | ORAL | Status: AC
  Administered 2022-04-17: 1000 mg via ORAL
  Filled 2022-04-17: qty 2

## 2022-04-17 MED ORDER — SODIUM CHLORIDE 0.9 % IR SOLN
Status: DC | PRN
  Administered 2022-04-17: 3000 mL via INTRAVESICAL

## 2022-04-17 MED ORDER — DEXAMETHASONE SODIUM PHOSPHATE 10 MG/ML IJ SOLN
INTRAMUSCULAR | Status: DC | PRN
  Administered 2022-04-17: 10 mg via INTRAVENOUS

## 2022-04-17 MED ORDER — VANCOMYCIN HCL IN DEXTROSE 1-5 GM/200ML-% IV SOLN
1000.0000 mg | INTRAVENOUS | Status: AC
  Administered 2022-04-17: 1000 mg via INTRAVENOUS
  Filled 2022-04-17: qty 200

## 2022-04-17 MED ORDER — SODIUM CHLORIDE (PF) 0.9 % IJ SOLN
INTRAMUSCULAR | Status: DC | PRN
  Administered 2022-04-17: 20 mL

## 2022-04-17 MED ORDER — GENTAMICIN SULFATE 40 MG/ML IJ SOLN
5.0000 mg/kg | INTRAVENOUS | Status: AC
  Administered 2022-04-17: 320 mg via INTRAVENOUS
  Filled 2022-04-17: qty 8

## 2022-04-17 MED ORDER — ONABOTULINUMTOXINA 100 UNITS IJ SOLR
INTRAMUSCULAR | Status: AC
  Filled 2022-04-17: qty 200

## 2022-04-17 MED ORDER — STERILE WATER FOR IRRIGATION IR SOLN
Status: DC | PRN
  Administered 2022-04-17: 500 mL

## 2022-04-17 MED ORDER — MIDAZOLAM HCL 2 MG/2ML IJ SOLN
INTRAMUSCULAR | Status: AC
  Filled 2022-04-17: qty 2

## 2022-04-17 MED ORDER — FENTANYL CITRATE (PF) 100 MCG/2ML IJ SOLN
INTRAMUSCULAR | Status: DC | PRN
  Administered 2022-04-17: 50 ug via INTRAVENOUS

## 2022-04-17 MED ORDER — LACTATED RINGERS IV SOLN: INTRAVENOUS | Status: DC

## 2022-04-17 MED ORDER — ONABOTULINUMTOXINA 100 UNITS IJ SOLR
INTRAMUSCULAR | Status: DC | PRN
  Administered 2022-04-17: 200 [IU] via INTRAMUSCULAR

## 2022-04-17 MED ORDER — SODIUM CHLORIDE (PF) 0.9 % IJ SOLN
INTRAMUSCULAR | Status: AC
  Filled 2022-04-17: qty 20

## 2022-04-17 MED ORDER — FENTANYL CITRATE (PF) 100 MCG/2ML IJ SOLN
INTRAMUSCULAR | Status: AC
  Filled 2022-04-17: qty 2

## 2022-04-17 SURGICAL SUPPLY — 21 items
BAG URINE DRAIN 2000ML AR STRL (UROLOGICAL SUPPLIES) IMPLANT
BAG URO CATCHER STRL LF (MISCELLANEOUS) ×1 IMPLANT
CATH FOLEY 2WAY SLVR  5CC 18FR (CATHETERS) ×1
CATH FOLEY 2WAY SLVR 5CC 18FR (CATHETERS) IMPLANT
CLOTH BEACON ORANGE TIMEOUT ST (SAFETY) ×1 IMPLANT
GLOVE SURG LX STRL 7.5 STRW (GLOVE) ×1 IMPLANT
GOWN STRL REUS W/ TWL XL LVL3 (GOWN DISPOSABLE) ×1 IMPLANT
GOWN STRL REUS W/TWL XL LVL3 (GOWN DISPOSABLE) ×1
IV NS IRRIG 3000ML ARTHROMATIC (IV SOLUTION) IMPLANT
KIT TURNOVER KIT A (KITS) IMPLANT
MANIFOLD NEPTUNE II (INSTRUMENTS) ×1 IMPLANT
NDL ASPIRATION 22 (NEEDLE) ×1 IMPLANT
NDL SAFETY ECLIP 18X1.5 (MISCELLANEOUS) IMPLANT
NEEDLE ASPIRATION 22 (NEEDLE) ×1 IMPLANT
PACK CYSTO (CUSTOM PROCEDURE TRAY) ×1 IMPLANT
PENCIL SMOKE EVACUATOR (MISCELLANEOUS) IMPLANT
PLUG CATH AND CAP STER (CATHETERS) IMPLANT
SYR CONTROL 10ML LL (SYRINGE) IMPLANT
TUBING CONNECTING 10 (TUBING) IMPLANT
WATER STERILE IRR 3000ML UROMA (IV SOLUTION) ×1 IMPLANT
WATER STERILE IRR 500ML POUR (IV SOLUTION) IMPLANT

## 2022-04-17 NOTE — Transfer of Care (Signed)
Immediate Anesthesia Transfer of Care Note  Patient: Ashley Gentry  Procedure(s) Performed: CYSTOSCOPY BOTOX INJECTION  Patient Location: PACU  Anesthesia Type:General  Level of Consciousness: drowsy  Airway & Oxygen Therapy: Patient Spontanous Breathing and Patient connected to face mask oxygen  Post-op Assessment: Report given to RN and Post -op Vital signs reviewed and stable  Post vital signs: Reviewed and stable  Last Vitals:  Vitals Value Taken Time  BP 130/86   Temp    Pulse 82   Resp 12   SpO2 100     Last Pain:  Vitals:   04/17/22 1011  TempSrc: Oral         Complications: No notable events documented.

## 2022-04-17 NOTE — Anesthesia Preprocedure Evaluation (Addendum)
Anesthesia Evaluation  Patient identified by MRN, date of birth, ID band Patient awake    Reviewed: Allergy & Precautions, NPO status , Patient's Chart, lab work & pertinent test results  History of Anesthesia Complications (+) PONV and history of anesthetic complications  Airway Mallampati: II  TM Distance: >3 FB Neck ROM: Full    Dental  (+) Teeth Intact, Dental Advisory Given   Pulmonary PE   Pulmonary exam normal breath sounds clear to auscultation       Cardiovascular + Peripheral Vascular Disease and + DVT  Normal cardiovascular exam Rhythm:Regular Rate:Normal     Neuro/Psych  PSYCHIATRIC DISORDERS Anxiety Depression Bipolar Disorder   T10 spinal cord injury, paraplegia negative neurological ROS     GI/Hepatic Neg liver ROS,GERD  ,,  Endo/Other  negative endocrine ROS    Renal/GU negative Renal ROS  negative genitourinary   Musculoskeletal  (+)  narcotic dependent  Abdominal   Peds  Hematology  (+) REFUSES BLOOD PRODUCTS (does not want albumin)  Anesthesia Other Findings   Reproductive/Obstetrics                             Anesthesia Physical Anesthesia Plan  ASA: 3  Anesthesia Plan: General   Post-op Pain Management: Tylenol PO (pre-op)*   Induction: Intravenous  PONV Risk Score and Plan: 4 or greater and Ondansetron, Dexamethasone, Midazolam, Treatment may vary due to age or medical condition and TIVA  Airway Management Planned: LMA  Additional Equipment:   Intra-op Plan:   Post-operative Plan: Extubation in OR  Informed Consent: I have reviewed the patients History and Physical, chart, labs and discussed the procedure including the risks, benefits and alternatives for the proposed anesthesia with the patient or authorized representative who has indicated his/her understanding and acceptance.   Patient has DNR.  Discussed DNR with patient and Continue DNR.    Dental advisory given  Plan Discussed with: CRNA  Anesthesia Plan Comments: (We had a long conversation about DNR. We discussed situations with surgery/anesthesia that are unlikely but that require brief CPR but likely do not result in a change in her long term quality of life. She does not want chest compressions even if it is for a short amount of time. She does not want defibrillation. She is ok with inubation/LMA placement. She is ok with medications to keep her heart rate and blood pressure stable. )       Anesthesia Quick Evaluation

## 2022-04-17 NOTE — Op Note (Signed)
Preoperative diagnosis: Neurogenic bladder Postoperative diagnosis: Same  Procedure: Cystoscopy with Botox injection 200 units, suprapubic tube change  Surgeon: Junious Silk  Anesthesia: General  Indication for procedure: Ashley Gentry is a 47 year old female with a history of neurogenic bladder from a T10/T11 injury.  She is maintained with a Foley catheter and was having further autonomic dysreflexia symptoms.  She was brought today for SP tube change and Botox.  Findings: On exam under anesthesia the abdomen was soft and nontender.  Suprapubic tube emanates to right lateral.  Vulva appeared normal.  No lesion or mass.  Meatus was slightly retracted and slight atrophic.  On cystoscopy the bladder was unremarkable.  There was some mild erythema from the balloon which had about 25 cc in it.  Trigone and ureteral orifices were in their normal orthotopic position.  Efflux clear bilaterally.  No stone in the bladder.  Description of procedure: After consent was obtained patient brought to the operating room.  After adequate anesthesia she was placed lithotomy position and prepped and draped in the usual sterile fashion.  Timeout was performed confirm the patient and procedure.  Cystoscope was passed per urethra and the bladder carefully inspected.  Suprapubic tube was removed and replaced with a clean 18 French Foley with 10 cc placed in the balloon.  It was seated at the bladder wall.  Bladder was irrigated multiple times.  Cystoscope was removed and the 0 degree injection scope was passed with the handle and needle.  Suprapubic tube was capped and bladder was filled with about 200 cc.  Botox 200 units was injected at 10 units/mL for a total of 20 injections in the usual grid like fashion.  Hemostasis was excellent at low pressure.  Suprapubic tube was connected to gravity drainage.  Drainage was clear.  She was awakened taken recovery room in stable condition.  Complications: None  Blood loss:  Minimal  Specimens: None  Drains: 18 French suprapubic tube  Disposition: Patient stable to PACU

## 2022-04-17 NOTE — Anesthesia Postprocedure Evaluation (Signed)
Anesthesia Post Note  Patient: Ashley Gentry  Procedure(s) Performed: CYSTOSCOPY BOTOX INJECTION     Patient location during evaluation: PACU Anesthesia Type: General Level of consciousness: awake and alert Pain management: pain level controlled Vital Signs Assessment: post-procedure vital signs reviewed and stable Respiratory status: spontaneous breathing, nonlabored ventilation, respiratory function stable and patient connected to nasal cannula oxygen Cardiovascular status: blood pressure returned to baseline and stable Postop Assessment: no apparent nausea or vomiting Anesthetic complications: no  No notable events documented.  Last Vitals:  Vitals:   04/17/22 1500 04/17/22 1530  BP: 116/79 (!) 141/85  Pulse: 98 100  Resp:  16  Temp:  (!) 36.1 C  SpO2: 92% 93%    Last Pain:  Vitals:   04/17/22 1530  TempSrc: Oral  PainSc: 0-No pain   Pain Goal:                   [name] [name]

## 2022-04-17 NOTE — Anesthesia Procedure Notes (Signed)
Procedure Name: LMA Insertion Date/Time: 04/17/2022 12:45 PM  Performed by: Gean Maidens, CRNAPre-anesthesia Checklist: Emergency Drugs available, Patient identified, Suction available, Patient being monitored and Timeout performed Patient Re-evaluated:Patient Re-evaluated prior to induction Oxygen Delivery Method: Circle system utilized Preoxygenation: Pre-oxygenation with 100% oxygen Induction Type: IV induction Ventilation: Mask ventilation without difficulty LMA: LMA inserted LMA Size: 4.0 Number of attempts: 1 Placement Confirmation: positive ETCO2 and breath sounds checked- equal and bilateral Tube secured with: Tape Dental Injury: Teeth and Oropharynx as per pre-operative assessment
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
## 2022-04-18 ENCOUNTER — Other Ambulatory Visit: Payer: Self-pay

## 2022-04-18 ENCOUNTER — Encounter (HOSPITAL_COMMUNITY): Payer: Self-pay | Admitting: Urology

## 2022-04-18 ENCOUNTER — Other Ambulatory Visit (HOSPITAL_BASED_OUTPATIENT_CLINIC_OR_DEPARTMENT_OTHER): Payer: Self-pay

## 2022-04-19 ENCOUNTER — Other Ambulatory Visit (HOSPITAL_BASED_OUTPATIENT_CLINIC_OR_DEPARTMENT_OTHER): Payer: Self-pay

## 2022-04-20 ENCOUNTER — Other Ambulatory Visit (HOSPITAL_BASED_OUTPATIENT_CLINIC_OR_DEPARTMENT_OTHER): Payer: Self-pay

## 2022-04-23 ENCOUNTER — Other Ambulatory Visit (HOSPITAL_BASED_OUTPATIENT_CLINIC_OR_DEPARTMENT_OTHER): Payer: Self-pay

## 2022-05-03 ENCOUNTER — Other Ambulatory Visit (HOSPITAL_BASED_OUTPATIENT_CLINIC_OR_DEPARTMENT_OTHER): Payer: Self-pay

## 2022-05-08 ENCOUNTER — Other Ambulatory Visit (HOSPITAL_BASED_OUTPATIENT_CLINIC_OR_DEPARTMENT_OTHER): Payer: Self-pay

## 2022-05-09 ENCOUNTER — Other Ambulatory Visit: Payer: Self-pay

## 2022-05-22 ENCOUNTER — Telehealth: Payer: Self-pay | Admitting: Neurology

## 2022-05-22 NOTE — Telephone Encounter (Signed)
[facility] is calling. Stated she needs face to face notes for pt transfer bench.

## 2022-05-22 NOTE — Telephone Encounter (Signed)
Noted  

## 2022-05-22 NOTE — Telephone Encounter (Signed)
Patient will need an up to date office visit with provider. She can be scheduled at next available slot, if nonething soon please put on cancellation list.

## 2022-07-05 ENCOUNTER — Telehealth: Payer: Self-pay | Admitting: Neurology

## 2022-07-09 ENCOUNTER — Telehealth: Payer: Self-pay | Admitting: Neurology

## 2022-07-09 NOTE — Telephone Encounter (Signed)
..   Pt understands that although there may be some limitations with this type of visit, we will take all precautions to reduce any security or privacy concerns.  Pt understands that this will be treated like an in office visit and we will file with pt's insurance, and there may be a patient responsible charge related to this service. ? ?

## 2022-07-09 NOTE — Telephone Encounter (Signed)
error 

## 2022-07-09 NOTE — Telephone Encounter (Signed)
Pt has mychart VV 07/11/22 with Dr. Epimenio Foot

## 2022-07-11 ENCOUNTER — Telehealth: Payer: Medicare PPO | Admitting: Neurology

## 2022-07-12 ENCOUNTER — Telehealth: Payer: Self-pay | Admitting: *Deleted

## 2022-07-12 NOTE — Telephone Encounter (Signed)
Faxed completed/signed professional verification form back to [facility] at [phone]. Received fax confirmation.

## 2022-08-16 ENCOUNTER — Telehealth (INDEPENDENT_AMBULATORY_CARE_PROVIDER_SITE_OTHER): Payer: Medicare PPO | Admitting: Neurology

## 2022-08-16 ENCOUNTER — Encounter: Payer: Self-pay | Admitting: Neurology

## 2022-08-16 DIAGNOSIS — R252 Cramp and spasm: Secondary | ICD-10-CM | POA: Diagnosis not present

## 2022-08-16 DIAGNOSIS — R208 Other disturbances of skin sensation: Secondary | ICD-10-CM

## 2022-08-16 DIAGNOSIS — G8220 Paraplegia, unspecified: Secondary | ICD-10-CM

## 2022-08-16 DIAGNOSIS — N319 Neuromuscular dysfunction of bladder, unspecified: Secondary | ICD-10-CM | POA: Diagnosis not present

## 2022-08-16 DIAGNOSIS — S24103S Unspecified injury at T7-T10 level of thoracic spinal cord, sequela: Secondary | ICD-10-CM

## 2022-08-16 DIAGNOSIS — G894 Chronic pain syndrome: Secondary | ICD-10-CM
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
## 2022-08-17 ENCOUNTER — Emergency Department (HOSPITAL_COMMUNITY)
Admission: EM | Admit: 2022-08-17 | Discharge: 2022-08-17 | Disposition: A | Discharge status: Home / Self Care | Payer: Medicare PPO | Attending: Emergency Medicine | Admitting: Emergency Medicine

## 2022-08-17 ENCOUNTER — Encounter (HOSPITAL_COMMUNITY): Payer: Self-pay | Admitting: Emergency Medicine

## 2022-08-17 ENCOUNTER — Other Ambulatory Visit: Payer: Self-pay

## 2022-08-17 ENCOUNTER — Emergency Department (HOSPITAL_COMMUNITY): Payer: Medicare PPO

## 2022-08-17 DIAGNOSIS — E876 Hypokalemia: Secondary | ICD-10-CM | POA: Insufficient documentation

## 2022-08-17 DIAGNOSIS — R319 Hematuria, unspecified: Secondary | ICD-10-CM | POA: Diagnosis not present

## 2022-08-17 DIAGNOSIS — N390 Urinary tract infection, site not specified: Secondary | ICD-10-CM | POA: Diagnosis not present

## 2022-08-17 DIAGNOSIS — D72829 Elevated white blood cell count, unspecified: Secondary | ICD-10-CM | POA: Insufficient documentation

## 2022-08-17 DIAGNOSIS — K529 Noninfective gastroenteritis and colitis, unspecified: Secondary | ICD-10-CM | POA: Diagnosis not present

## 2022-08-17 DIAGNOSIS — R112 Nausea with vomiting, unspecified: Secondary | ICD-10-CM | POA: Diagnosis present

## 2022-08-17 LAB — CBC WITH DIFFERENTIAL/PLATELET
Abs Immature Granulocytes: 0.97 10*3/uL — ABNORMAL HIGH (ref 0.00–0.07)
Basophils Absolute: 0 10*3/uL (ref 0.0–0.1)
Basophils Relative: 0 %
Eosinophils Absolute: 0.9 10*3/uL — ABNORMAL HIGH (ref 0.0–0.5)
Eosinophils Relative: 6 %
HCT: 55.6 % — ABNORMAL HIGH (ref 36.0–46.0)
Hemoglobin: 17.2 g/dL — ABNORMAL HIGH (ref 12.0–15.0)
Immature Granulocytes: 6 %
Lymphocytes Relative: 12 %
Lymphs Abs: 1.8 10*3/uL (ref 0.7–4.0)
MCH: 28.6 pg (ref 26.0–34.0)
MCHC: 30.9 g/dL (ref 30.0–36.0)
MCV: 92.4 fL (ref 80.0–100.0)
Monocytes Absolute: 0.7 10*3/uL (ref 0.1–1.0)
Monocytes Relative: 5 %
Neutro Abs: 11.2 10*3/uL — ABNORMAL HIGH (ref 1.7–7.7)
Neutrophils Relative %: 71 %
Platelets: 328 10*3/uL (ref 150–400)
RBC: 6.02 MIL/uL — ABNORMAL HIGH (ref 3.87–5.11)
RDW: 15.7 % — ABNORMAL HIGH (ref 11.5–15.5)
WBC: 15.6 10*3/uL — ABNORMAL HIGH (ref 4.0–10.5)
nRBC: 0 % (ref 0.0–0.2)

## 2022-08-17 LAB — COMPREHENSIVE METABOLIC PANEL
ALT: 26 U/L (ref 0–44)
AST: 16 U/L (ref 15–41)
Albumin: 3.8 g/dL (ref 3.5–5.0)
Alkaline Phosphatase: 117 U/L (ref 38–126)
Anion gap: 11 (ref 5–15)
BUN: 15 mg/dL (ref 6–20)
CO2: 24 mmol/L (ref 22–32)
Calcium: 9 mg/dL (ref 8.9–10.3)
Chloride: 102 mmol/L (ref 98–111)
Creatinine, Ser: 0.45 mg/dL (ref 0.44–1.00)
GFR, Estimated: >60 mL/min (ref >60)
Glucose, Bld: 141 mg/dL — ABNORMAL HIGH (ref 70–99)
Potassium: 3.2 mmol/L — ABNORMAL LOW (ref 3.5–5.1)
Sodium: 137 mmol/L (ref 135–145)
Total Bilirubin: 0.6 mg/dL (ref 0.3–1.2)
Total Protein: 8.1 g/dL (ref 6.5–8.1)

## 2022-08-17 LAB — URINALYSIS, ROUTINE W REFLEX MICROSCOPIC
Bilirubin Urine: NEGATIVE
Glucose, UA: NEGATIVE mg/dL
Ketones, ur: 20 mg/dL — AB
Nitrite: POSITIVE — AB
Protein, ur: 100 mg/dL — AB
RBC / HPF: >50 RBC/hpf (ref 0–5)
Specific Gravity, Urine: >1.046 — ABNORMAL HIGH (ref 1.005–1.030)
WBC, UA: >50 WBC/hpf (ref 0–5)
pH: 5 (ref 5.0–8.0)

## 2022-08-17 LAB — LIPASE, BLOOD: Lipase: 25 U/L (ref 11–51)

## 2022-08-17 MED ORDER — SUCRALFATE 1 G PO TABS: 1.0000 g | ORAL_TABLET | Freq: Three times a day (TID) | ORAL | 0 refills | Status: AC

## 2022-08-17 MED ORDER — SODIUM CHLORIDE 0.9 % IV SOLN
1.0000 g | Freq: Once | INTRAVENOUS | Status: AC
  Administered 2022-08-17: 1 g via INTRAVENOUS
  Filled 2022-08-17: qty 10

## 2022-08-17 MED ORDER — ONDANSETRON HCL 4 MG/2ML IJ SOLN
4.0000 mg | Freq: Once | INTRAMUSCULAR | Status: AC
  Administered 2022-08-17: 4 mg via INTRAVENOUS
  Filled 2022-08-17: qty 2

## 2022-08-17 MED ORDER — DICYCLOMINE HCL 20 MG PO TABS: 20.0000 mg | ORAL_TABLET | Freq: Two times a day (BID) | ORAL | 0 refills | Status: AC

## 2022-08-17 MED ORDER — FAMOTIDINE IN NACL 20-0.9 MG/50ML-% IV SOLN
20.0000 mg | Freq: Once | INTRAVENOUS | Status: AC
  Administered 2022-08-17: 20 mg via INTRAVENOUS
  Filled 2022-08-17: qty 50

## 2022-08-17 MED ORDER — SODIUM CHLORIDE 0.9 % IV SOLN
1000.0000 mL | INTRAVENOUS | Status: DC
  Administered 2022-08-17: 1000 mL via INTRAVENOUS

## 2022-08-17 MED ORDER — PROCHLORPERAZINE EDISYLATE 10 MG/2ML IJ SOLN
10.0000 mg | Freq: Once | INTRAMUSCULAR | Status: AC
  Administered 2022-08-17: 10 mg via INTRAVENOUS
  Filled 2022-08-17: qty 2

## 2022-08-17 MED ORDER — KETOROLAC TROMETHAMINE 15 MG/ML IJ SOLN
15.0000 mg | Freq: Once | INTRAMUSCULAR | Status: AC
  Administered 2022-08-17: 15 mg via INTRAVENOUS
  Filled 2022-08-17: qty 1

## 2022-08-17 MED ORDER — SODIUM CHLORIDE (PF) 0.9 % IJ SOLN
INTRAMUSCULAR | Status: AC
  Filled 2022-08-17: qty 50

## 2022-08-17 MED ORDER — ONDANSETRON HCL 4 MG PO TABS: 4.0000 mg | ORAL_TABLET | ORAL | 0 refills | Status: DC | PRN

## 2022-08-17 MED ORDER — DICYCLOMINE HCL 10 MG/ML IM SOLN
20.0000 mg | Freq: Once | INTRAMUSCULAR | Status: AC
  Administered 2022-08-17: 20 mg via INTRAMUSCULAR
  Filled 2022-08-17: qty 2

## 2022-08-17 MED ORDER — CEPHALEXIN 500 MG PO CAPS: 500.0000 mg | ORAL_CAPSULE | Freq: Three times a day (TID) | ORAL | 0 refills | Status: AC

## 2022-08-17 MED ORDER — SODIUM CHLORIDE 0.9 % IV BOLUS (SEPSIS)
1000.0000 mL | Freq: Once | INTRAVENOUS | Status: AC
  Administered 2022-08-17: 1000 mL via INTRAVENOUS

## 2022-08-17 MED ORDER — IOHEXOL 300 MG/ML  SOLN
100.0000 mL | Freq: Once | INTRAMUSCULAR | Status: AC | PRN
  Administered 2022-08-17: 100 mL via INTRAVENOUS
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
## 2022-08-17 NOTE — Discharge Instructions (Signed)
It was a pleasure caring for you today in the emergency department. ° °Please return to the emergency department for any worsening or worrisome symptoms. ° ° °

## 2022-08-17 NOTE — ED Triage Notes (Addendum)
Pt bib ems from home c/o n/v, abdominal distension and decreased urinary output for approx 2 days. 4mg  zofran given IM pta. Pt reports burning at catheter site and existing sacral wounds that are currently being treated.   BP 134/91, HR 102, Spo2 99%
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
## 2022-08-17 NOTE — ED Notes (Signed)
Patient off the floor to CT.

## 2022-08-19 LAB — URINE CULTURE: Culture: >=100000 — AB

## 2022-08-20 ENCOUNTER — Telehealth: Payer: Self-pay | Admitting: Neurology

## 2022-08-20 ENCOUNTER — Emergency Department (HOSPITAL_COMMUNITY): Payer: Medicare PPO

## 2022-08-20 ENCOUNTER — Telehealth (HOSPITAL_BASED_OUTPATIENT_CLINIC_OR_DEPARTMENT_OTHER): Payer: Self-pay | Admitting: *Deleted

## 2022-08-20 ENCOUNTER — Telehealth: Payer: Self-pay | Admitting: *Deleted

## 2022-08-20 ENCOUNTER — Emergency Department (HOSPITAL_COMMUNITY)
Admission: EM | Admit: 2022-08-20 | Discharge: 2022-08-20 | Disposition: A | Discharge status: Home / Self Care | Payer: Medicare PPO | Attending: Emergency Medicine | Admitting: Emergency Medicine

## 2022-08-20 ENCOUNTER — Other Ambulatory Visit: Payer: Self-pay

## 2022-08-20 ENCOUNTER — Encounter (HOSPITAL_COMMUNITY): Payer: Self-pay

## 2022-08-20 DIAGNOSIS — N390 Urinary tract infection, site not specified: Secondary | ICD-10-CM | POA: Diagnosis not present

## 2022-08-20 DIAGNOSIS — R197 Diarrhea, unspecified: Secondary | ICD-10-CM | POA: Diagnosis not present

## 2022-08-20 DIAGNOSIS — R6883 Chills (without fever): Secondary | ICD-10-CM | POA: Diagnosis not present

## 2022-08-20 DIAGNOSIS — D72829 Elevated white blood cell count, unspecified: Secondary | ICD-10-CM | POA: Insufficient documentation

## 2022-08-20 DIAGNOSIS — R1084 Generalized abdominal pain: Secondary | ICD-10-CM | POA: Diagnosis not present

## 2022-08-20 DIAGNOSIS — R112 Nausea with vomiting, unspecified: Secondary | ICD-10-CM | POA: Diagnosis present

## 2022-08-20 LAB — CBC WITH DIFFERENTIAL/PLATELET
Abs Immature Granulocytes: 0.76 10*3/uL — ABNORMAL HIGH (ref 0.00–0.07)
Basophils Absolute: 0.1 10*3/uL (ref 0.0–0.1)
Basophils Relative: 1 %
Eosinophils Absolute: 1.9 10*3/uL — ABNORMAL HIGH (ref 0.0–0.5)
Eosinophils Relative: 15 %
HCT: 42 % (ref 36.0–46.0)
Hemoglobin: 13.2 g/dL (ref 12.0–15.0)
Immature Granulocytes: 6 %
Lymphocytes Relative: 18 %
Lymphs Abs: 2.2 10*3/uL (ref 0.7–4.0)
MCH: 28.5 pg (ref 26.0–34.0)
MCHC: 31.4 g/dL (ref 30.0–36.0)
MCV: 90.7 fL (ref 80.0–100.0)
Monocytes Absolute: 0.6 10*3/uL (ref 0.1–1.0)
Monocytes Relative: 5 %
Neutro Abs: 6.6 10*3/uL (ref 1.7–7.7)
Neutrophils Relative %: 55 %
Platelets: 210 10*3/uL (ref 150–400)
RBC: 4.63 MIL/uL (ref 3.87–5.11)
RDW: 14.9 % (ref 11.5–15.5)
WBC: 12.1 10*3/uL — ABNORMAL HIGH (ref 4.0–10.5)
nRBC: 0 % (ref 0.0–0.2)

## 2022-08-20 LAB — URINALYSIS, ROUTINE W REFLEX MICROSCOPIC
Bilirubin Urine: NEGATIVE
Glucose, UA: NEGATIVE mg/dL
Ketones, ur: 20 mg/dL — AB
Nitrite: NEGATIVE
Protein, ur: NEGATIVE mg/dL
Specific Gravity, Urine: 1.011 (ref 1.005–1.030)
pH: 6 (ref 5.0–8.0)

## 2022-08-20 LAB — COMPREHENSIVE METABOLIC PANEL
ALT: 26 U/L (ref 0–44)
AST: 25 U/L (ref 15–41)
Albumin: 3.1 g/dL — ABNORMAL LOW (ref 3.5–5.0)
Alkaline Phosphatase: 84 U/L (ref 38–126)
Anion gap: 9 (ref 5–15)
BUN: 7 mg/dL (ref 6–20)
CO2: 23 mmol/L (ref 22–32)
Calcium: 8.5 mg/dL — ABNORMAL LOW (ref 8.9–10.3)
Chloride: 104 mmol/L (ref 98–111)
Creatinine, Ser: 0.54 mg/dL (ref 0.44–1.00)
GFR, Estimated: >60 mL/min (ref >60)
Glucose, Bld: 107 mg/dL — ABNORMAL HIGH (ref 70–99)
Potassium: 3.8 mmol/L (ref 3.5–5.1)
Sodium: 136 mmol/L (ref 135–145)
Total Bilirubin: 1 mg/dL (ref 0.3–1.2)
Total Protein: 6.5 g/dL (ref 6.5–8.1)

## 2022-08-20 LAB — LIPASE, BLOOD: Lipase: 44 U/L (ref 11–51)

## 2022-08-20 LAB — LITHIUM LEVEL: Lithium Lvl: 0.13 mmol/L — ABNORMAL LOW (ref 0.60–1.20)

## 2022-08-20 MED ORDER — PROMETHAZINE HCL 25 MG PO TABS: 25.0000 mg | ORAL_TABLET | Freq: Four times a day (QID) | ORAL | 0 refills | Status: DC | PRN

## 2022-08-20 MED ORDER — ONDANSETRON HCL 4 MG/2ML IJ SOLN
4.0000 mg | Freq: Once | INTRAMUSCULAR | Status: AC
  Administered 2022-08-20: 4 mg via INTRAVENOUS
  Filled 2022-08-20: qty 2

## 2022-08-20 MED ORDER — MORPHINE SULFATE (PF) 4 MG/ML IV SOLN
4.0000 mg | Freq: Once | INTRAVENOUS | Status: AC
  Administered 2022-08-20: 4 mg via INTRAVENOUS
  Filled 2022-08-20: qty 1

## 2022-08-20 MED ORDER — OXYCODONE-ACETAMINOPHEN 5-325 MG PO TABS: 1.0000 | ORAL_TABLET | Freq: Four times a day (QID) | ORAL | 0 refills | Status: DC | PRN

## 2022-08-20 MED ORDER — IOHEXOL 300 MG/ML  SOLN
100.0000 mL | Freq: Once | INTRAMUSCULAR | Status: AC | PRN
  Administered 2022-08-20: 100 mL via INTRAVENOUS

## 2022-08-20 MED ORDER — SODIUM CHLORIDE 0.9 % IV BOLUS
500.0000 mL | Freq: Once | INTRAVENOUS | Status: AC
  Administered 2022-08-20: 500 mL via INTRAVENOUS

## 2022-08-20 MED ORDER — NITROFURANTOIN MONOHYD MACRO 100 MG PO CAPS: 100.0000 mg | ORAL_CAPSULE | Freq: Two times a day (BID) | ORAL | 0 refills | Status: DC

## 2022-08-20 NOTE — Telephone Encounter (Signed)
Post ED Visit - Positive Culture Follow-up  Culture report reviewed by antimicrobial stewardship pharmacist: [facility] []  Enzo Bi, Pharm.D. []  Celedonio Miyamoto, Pharm.D., BCPS AQ-ID []  Garvin Fila, Pharm.D., BCPS []  Georgina Pillion, Pharm.D., BCPS []  [place], [address].D., BCPS, AAHIVP []  Estella Husk, Pharm.D., BCPS, AAHIVP []  Lysle Pearl, PharmD, BCPS []  Phillips Climes, PharmD, BCPS []  Agapito Games, PharmD, BCPS []  Verlan Friends, PharmD []  Mervyn Gay, PharmD, BCPS []  Vinnie Level, PharmD  [facility] []  Len Childs, PharmD []  Greer Pickerel, PharmD []  Adalberto Cole, PharmD []  Perlie Gold, Rph []  Lonell Face) Jean Rosenthal, PharmD []  Earl Many, PharmD []  Junita Push, PharmD []  Dorna Leitz, PharmD []  Terrilee Files, PharmD []  Lynann Beaver, PharmD []  Keturah Barre, PharmD []  Loralee Pacas, PharmD [x]  Georgina Pillion PharmD   Positive urine culture Treated with Macrobid, organism sensitive to the same and no further patient follow-up is required at this time.  Patsey Berthold 08/20/2022, 2:05 PM

## 2022-08-20 NOTE — ED Notes (Signed)
Patient able to tolerate 1/2 breakfast sandwich and all of juice

## 2022-08-20 NOTE — ED Notes (Signed)
[facility] called back to cancel transportation patient called personal transportation

## 2022-08-20 NOTE — Telephone Encounter (Signed)
Sent mychart msg informing pt of follow up made with Dr. Sater 

## 2022-08-20 NOTE — Discharge Instructions (Addendum)
Please take your antibiotics as prescribed. Take tylenol/ibuprofen up to three times a day or Percocet as needed for pain. I recommend close follow-up with PCP and GI for reevaluation.  Please do not hesitate to return to emergency department if worrisome signs symptoms we discussed become apparent.

## 2022-08-20 NOTE — ED Notes (Signed)
[facility] called to have transportation set up for patient
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
## 2022-08-20 NOTE — Telephone Encounter (Signed)
Faxed completed/signed office note to [employer] for power wheelchair at [phone]. Received fax confirmation.

## 2022-08-20 NOTE — ED Triage Notes (Signed)
Pt BIB EMS from home for N/V/D and chills. Pt was last seen 2 days ago for the same symptoms  106/72 134 cbg  4mg  zofran given  NS  20g Right forearm

## 2022-08-20 NOTE — ED Notes (Signed)
Nutrition given to patient
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
## 2022-08-23 ENCOUNTER — Other Ambulatory Visit: Payer: Self-pay | Admitting: Neurology

## 2022-08-24 NOTE — Telephone Encounter (Signed)
Requested Prescriptions   Pending Prescriptions Disp Refills   pregabalin (LYRICA) 200 MG capsule [Pharmacy Med Name: Pregabalin 200 MG Oral Capsule] 90 capsule 0    Sig: TAKE 1 CAPSULE BY MOUTH THREE TIMES DAILY AS NEEDED FOR PAIN   Last seen 08/16/22 by sater, next appt scheduled 02/21/23 Dispenses   Dispensed Days Supply Quantity Provider Pharmacy  PREGABALIN 200MG  CAP 07/05/2022 30 90 each Sater, Pearletha Furl, MD [facility]  PREGABALIN 200MG  CAP 06/28/2022 30 90 each Sater, Pearletha Furl, MD [facility]  PREGABALIN 200MG  CAP 04/24/2022 30 90 each Sater, Pearletha Furl, MD [facility]  PREGABALIN 200MG  CAP 04/11/2022 30 90 each Sater, Pearletha Furl, MD [facility]  PREGABALIN 200MG  CAPSULES 02/25/2022 30 90 each Sater, Pearletha Furl, MD [facility] #...  PREGABALIN 200MG  CAPSULES 01/16/2022 30 90 each Sater, Pearletha Furl, MD [facility] #...  PREGABALIN 200MG  CAPSULES 12/05/2021 30 90 each Sater, Pearletha Furl, MD [facility]ORE #...  PREGABALIN 200MG  CAPSULES 10/25/2021 30 90 each Sater, Pearletha Furl, MD [facility] #...  PREGABALIN 200MG  CAPSULES 09/04/2021 45 90 each Sater, Pearletha Furl, MD [facility] DR[facility] STORE #.Marland KitchenMarland Kitchen

## 2022-09-04 NOTE — Telephone Encounter (Signed)
Received another fax request asking for last office note/signed by MD. I re-faxed to [employer] at [phone]. Received fax confirmation.

## 2022-09-06 NOTE — Telephone Encounter (Signed)
Faxed signed order back to [employer] that included: PT.OT eval, PMD standard written order, PSWO to [phone]. Received fax confirmation.

## 2022-09-29 ENCOUNTER — Telehealth: Payer: Medicare PPO | Admitting: Emergency Medicine

## 2022-09-29 DIAGNOSIS — Z9889 Other specified postprocedural states: Secondary | ICD-10-CM

## 2022-09-29 DIAGNOSIS — Z87448 Personal history of other diseases of urinary system: Secondary | ICD-10-CM

## 2022-09-29 MED ORDER — NITROFURANTOIN MONOHYD MACRO 100 MG PO CAPS: 100.0000 mg | ORAL_CAPSULE | Freq: Two times a day (BID) | ORAL | 0 refills | Status: AC

## 2022-09-29 MED ORDER — FLUCONAZOLE 150 MG PO TABS: 150.0000 mg | ORAL_TABLET | Freq: Once | ORAL | 0 refills | Status: AC

## 2022-09-29 NOTE — Patient Instructions (Signed)
Ashley Gentry [id number], thank you for joining [place], PA-C for today's virtual visit.  While this provider is not your primary care provider (PCP), if your PCP is located in our provider database this encounter information will be shared with them immediately following your visit.   A [facility] MyChart account gives you access to today's visit and all your visits, tests, and labs performed at [facility] " click here if you don't have a [facility] MyChart account or go to [email or web address]  Consent: (Patient) Ashley Gentry provided verbal consent for this virtual visit at the beginning of the encounter.  Current Medications:  Current Outpatient Medications:    fluconazole (DIFLUCAN) 150 MG tablet, Take 1 tablet (150 mg total) by mouth once for 1 dose., Disp: 1 tablet, Rfl: 0   nitrofurantoin, macrocrystal-monohydrate, (MACROBID) 100 MG capsule, Take 1 capsule (100 mg total) by mouth 2 (two) times daily for 5 days., Disp: 10 capsule, Rfl: 0   albuterol (VENTOLIN HFA) 108 (90 Base) MCG/ACT inhaler, Inhale 2 puffs into the lungs every 6 (six) hours as needed for wheezing or shortness of breath., Disp: , Rfl:    amphetamine-dextroamphetamine (ADDERALL XR) 30 MG 24 hr capsule, Take 30 mg by mouth daily., Disp: , Rfl:    bisacodyl (DULCOLAX) 5 MG EC tablet, Take 5 mg by mouth at bedtime as needed for moderate constipation., Disp: , Rfl:    ciprofloxacin (CIPRO) 250 MG tablet, Take 250 mg by mouth 2 (two) times daily., Disp: , Rfl:    D3-50 1.25 MG (50000 UT) capsule, Take 50,000 Units by mouth every Monday., Disp: , Rfl:    dexmethylphenidate (FOCALIN) 5 MG tablet, Take by mouth., Disp: , Rfl:    dicyclomine (BENTYL) 20 MG tablet, Take 1 tablet (20 mg total) by mouth 2 (two) times daily., Disp: 20 tablet, Rfl: 0   DULoxetine (CYMBALTA) 60 MG capsule, Take 60 mg by mouth daily., Disp: , Rfl:    ibuprofen (ADVIL) 200 MG tablet, Take 400 mg by mouth every 6 (six) hours  as needed for moderate pain., Disp: , Rfl:    lamoTRIgine (LAMICTAL) 200 MG tablet, Take 1 tablet (200 mg total) by mouth 2 (two) times daily. (Patient taking differently: Take 200 mg by mouth daily.), Disp: 180 tablet, Rfl: 3   lidocaine (XYLOCAINE) 5 % ointment, Apply 1 application. topically as needed. Apply 1/2 inch to site up to tice a day. (Patient taking differently: Apply 1 application  topically as needed (pain). Apply 1/2 inch to site up to twice a day.), Disp: 35.44 g, Rfl: 0   lithium carbonate 300 MG capsule, Take 300 mg by mouth every evening., Disp: , Rfl:    metFORMIN (GLUCOPHAGE-XR) 500 MG 24 hr tablet, Take 500 mg by mouth 2 (two) times daily., Disp: , Rfl:    methocarbamol (ROBAXIN) 750 MG tablet, Take 750 mg by mouth 2 (two) times daily as needed for muscle spasms., Disp: , Rfl:    NYSTATIN powder, Apply 1 application  topically 4 (four) times daily as needed (rash)., Disp: , Rfl:    omeprazole (PRILOSEC) 40 MG capsule, Take 40 mg by mouth daily., Disp: , Rfl:    ondansetron (ZOFRAN) 4 MG tablet, Take 4 mg by mouth every 8 (eight) hours as needed for nausea or vomiting., Disp: , Rfl:    ondansetron (ZOFRAN) 4 MG tablet, Take 1 tablet (4 mg total) by mouth every 4 (four) hours as needed for nausea or vomiting., Disp: 12 tablet, Rfl:  0   ondansetron (ZOFRAN-ODT) 4 MG disintegrating tablet, Take 4 mg by mouth every 8 (eight) hours as needed for nausea or vomiting., Disp: , Rfl:    oxyCODONE-acetaminophen (PERCOCET/ROXICET) 5-325 MG tablet, Take 1 tablet by mouth every 6 (six) hours as needed for severe pain., Disp: , Rfl:    oxyCODONE-acetaminophen (PERCOCET/ROXICET) 5-325 MG tablet, Take 1 tablet by mouth every 6 (six) hours as needed for up to 8 doses for severe pain., Disp: 6 tablet, Rfl: 0   pramipexole (MIRAPEX) 1.5 MG tablet, Take 1.5 mg by mouth at bedtime., Disp: , Rfl:    pregabalin (LYRICA) 200 MG capsule, Take 1 capsule (200 mg total) by mouth 3 (three) times daily., Disp:  270 capsule, Rfl: 1   promethazine (PHENERGAN) 25 MG tablet, Take 1 tablet (25 mg total) by mouth every 6 (six) hours as needed for nausea or vomiting., Disp: 30 tablet, Rfl: 2   promethazine (PHENERGAN) 25 MG tablet, Take 1 tablet (25 mg total) by mouth every 6 (six) hours as needed for up to 15 doses for nausea or vomiting., Disp: 15 tablet, Rfl: 0   QUEtiapine (SEROQUEL) 200 MG tablet, Take 200-300 mg by mouth See admin instructions. Take 200 mg at bedtime, may take a second 100 mg dose as needed for sleep, Disp: , Rfl:    Semaglutide-Weight Management (WEGOVY) 0.25 MG/0.5ML SOAJ, Inject 0.25 mg into the skin once a week weeks 1 through 4 of therapy, Disp: 2 mL, Rfl: 9   senna (SENOKOT) 8.6 MG TABS tablet, Take 2 tablets by mouth at bedtime., Disp: , Rfl:    sucralfate (CARAFATE) 1 g tablet, Take 1 tablet (1 g total) by mouth with breakfast, with lunch, and with evening meal for 7 days., Disp: 21 tablet, Rfl: 0   Medications ordered in this encounter:  Meds ordered this encounter  Medications   nitrofurantoin, macrocrystal-monohydrate, (MACROBID) 100 MG capsule    Sig: Take 1 capsule (100 mg total) by mouth 2 (two) times daily for 5 days.    Dispense:  10 capsule    Refill:  0    Order Specific Question:   Supervising Provider    Answer:   Merrilee Jansky [[id number]]   fluconazole (DIFLUCAN) 150 MG tablet    Sig: Take 1 tablet (150 mg total) by mouth once for 1 dose.    Dispense:  1 tablet    Refill:  0    Order Specific Question:   Supervising Provider    Answer:   Merrilee Jansky [id number]     *If you need refills on other medications prior to your next appointment, please contact your pharmacy*  Follow-Up: Call back or seek an in-person evaluation if the symptoms worsen or if the condition fails to improve as anticipated.  [facility] [phone]  Other Instructions Based on symptoms and hx of frequent UTIs will cover today for possible UTI Last culture was  sensitive to macrobid, bactrim, and cipro.  However, patient reports SE/ allergies to bactrim and cipro.  Will try macrobid.  Instructed to go to the ED if symptoms worsened or did not improve over the next 12 - 24 hours Also requests diflucan for yeast prophylaxis, reviewed medication interactions with current medications on file and instructed to hold current medications while taking diflucan.  DO NOT TAKE WHILE TAKING OXYCODONE, SEROQUEL, AND/OR OMEPRAZOLE.  Also reports having diflucan in the past without adverse effect.   Push fluids and get plenty of rest.  Take antibiotic as directed and to completion Follow up in person at urgent care or go to ER if you have any new or worsening symptoms such as fever, worsening abdominal pain, nausea/vomiting, flank pain, etc...   If you have been instructed to have an in-person evaluation today at a local Urgent Care facility, please use the link below. It will take you to a list of all of our available [facility], including address, phone number and hours of operation. Please do not delay care.  [facility]  If you or a family member do not have a primary care provider, use the link below to schedule a visit and establish care. When you choose a [facility] primary care physician or advanced practice provider, you gain a long-term partner in health. Find a Primary Care Provider  Learn more about [facility] in-office and virtual care options: [place] - Get Care Now

## 2022-09-29 NOTE — Progress Notes (Signed)
Virtual Visit Consent   Ashley Gentry, you are scheduled for a virtual visit with a [name] provider today. Just as with appointments in the office, your consent must be obtained to participate. Your consent will be active for this visit and any virtual visit you may have with one of our providers in the next 365 days. If you have a MyChart account, a copy of this consent can be sent to you electronically.  As this is a virtual visit, video technology does not allow for your provider to perform a traditional examination. This may limit your provider's ability to fully assess your condition. If your provider identifies any concerns that need to be evaluated in person or the need to arrange testing (such as labs, EKG, etc.), we will make arrangements to do so. Although advances in technology are sophisticated, we cannot ensure that it will always work on either your end or our end. If the connection with a video visit is poor, the visit may have to be switched to a telephone visit. With either a video or telephone visit, we are not always able to ensure that we have a secure connection.  By engaging in this virtual visit, you consent to the provision of healthcare and authorize for your insurance to be billed (if applicable) for the services provided during this visit. Depending on your insurance coverage, you may receive a charge related to this service.  I need to obtain your verbal consent now. Are you willing to proceed with your visit today? Ashley Gentry has provided verbal consent on 09/29/2022 for a virtual visit (video or telephone). Ashley Gentry, [place]  Date: 09/29/2022 2:20 PM  Virtual Visit via Video Note   I, Ashley Gentry, connected with  Keiana Luedke  ([id number], 10/29/1975) on 09/29/22 at 12:15 PM EDT by a video-enabled telemedicine application and verified that I am speaking with the correct person using two identifiers.  Location: Patient: Virtual Visit  Location Patient: Home Provider: Virtual Visit Location Provider: Home Office   I discussed the limitations of evaluation and management by telemedicine and the availability of in person appointments. The patient expressed understanding and agreed to proceed.    History of Present Illness: Ashley Gentry is a 47 y.o. who identifies as a female who was assigned female at birth, and is being seen today for discharge and burning from suprapubic catheter port as well as suprapubic pressure x 3 days  Symptoms began after changing catheter. Has tried rx'ed bentyl with some relief.  Symptoms are made worse with changing catheter.  Admits to similar symptoms in the past with UTI.  Seen in May recently for UTI and started on Macrobid with much improvement.  Reports associated nausea and dark urine odor as well.  Denies fever, chills, vomiting, abdominal pain, flank pain, hematuria.    Suprapubic catheter in place due to spinal cord injury that occurred in 2019.    LMP: No LMP recorded. Patient has had a hysterectomy.  ROS: As in HPI.  All other pertinent ROS negative.     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   Insomnia 01/22/2018  T10 spinal cord injury (HCC) 12/25/2017   Paraplegia (HCC) 12/25/2017   Neurogenic bladder 12/25/2017   Neurogenic bowel 12/25/2017   Abdominal distension (gaseous) 11/27/2017   Generalized abdominal pain 11/27/2017   Constipation due to neurogenic bowel 11/27/2017   Gastroesophageal reflux disease 11/27/2017   NSAID long-term use 11/27/2017    Allergies:  Allergies  Allergen Reactions    Ciprofloxacin     Abdominal Pain, GI intolerance, Hypotension,  Patient stated "made her feel uncomfortable" and lightheaded    Sulfamethoxazole-Trimethoprim Nausea And Vomiting   Myrbetriq [Mirabegron]     dizziness   Medications:  Current Outpatient Medications:    fluconazole (DIFLUCAN) 150 MG tablet, Take 1 tablet (150 mg total) by mouth once for 1 dose., Disp: 1 tablet, Rfl: 0   nitrofurantoin, macrocrystal-monohydrate, (MACROBID) 100 MG capsule, Take 1 capsule (100 mg total) by mouth 2 (two) times daily for 5 days., Disp: 10 capsule, Rfl: 0   albuterol (VENTOLIN HFA) 108 (90 Base) MCG/ACT inhaler, Inhale 2 puffs into the lungs every 6 (six) hours as needed for wheezing or shortness of breath., Disp: , Rfl:    amphetamine-dextroamphetamine (ADDERALL XR) 30 MG 24 hr capsule, Take 30 mg by mouth daily., Disp: , Rfl:    bisacodyl (DULCOLAX) 5 MG EC tablet, Take 5 mg by mouth at bedtime as needed for moderate constipation., Disp: , Rfl:    ciprofloxacin (CIPRO) 250 MG tablet, Take 250 mg by mouth 2 (two) times daily., Disp: , Rfl:    D3-50 1.25 MG (50000 UT) capsule, Take 50,000 Units by mouth every Monday., Disp: , Rfl:    dexmethylphenidate (FOCALIN) 5 MG tablet, Take by mouth., Disp: , Rfl:    dicyclomine (BENTYL) 20 MG tablet, Take 1 tablet (20 mg total) by mouth 2 (two) times daily., Disp: 20 tablet, Rfl: 0   DULoxetine (CYMBALTA) 60 MG capsule, Take 60 mg by mouth daily., Disp: , Rfl:    ibuprofen (ADVIL) 200 MG tablet, Take 400 mg by mouth every 6 (six) hours as needed for moderate pain., Disp: , Rfl:    lamoTRIgine (LAMICTAL) 200 MG tablet, Take 1 tablet (200 mg total) by mouth 2 (two) times daily. (Patient taking differently: Take 200 mg by mouth daily.), Disp: 180 tablet, Rfl: 3   lidocaine (XYLOCAINE) 5 % ointment, Apply 1 application. topically as needed. Apply 1/2 inch to site up to tice a day. (Patient taking differently: Apply 1 application  topically as needed (pain). Apply 1/2  inch to site up to twice a day.), Disp: 35.44 g, Rfl: 0   lithium carbonate 300 MG capsule, Take 300 mg by mouth every evening., Disp: , Rfl:    metFORMIN (GLUCOPHAGE-XR) 500 MG 24 hr tablet, Take 500 mg by mouth 2 (two) times daily., Disp: , Rfl:    methocarbamol (ROBAXIN) 750 MG tablet, Take 750 mg by mouth 2 (two) times daily as needed for muscle spasms., Disp: , Rfl:    NYSTATIN powder, Apply 1 application  topically 4 (four) times daily as needed (rash)., Disp: , Rfl:    omeprazole (PRILOSEC) 40 MG capsule, Take 40 mg by mouth daily., Disp: , Rfl:    ondansetron (ZOFRAN) 4 MG tablet, Take 4 mg by mouth every 8 (eight) hours as needed for nausea or vomiting., Disp: , Rfl:    ondansetron (ZOFRAN) 4 MG tablet, Take 1 tablet (4 mg total) by mouth every 4 (four) hours as needed for nausea or vomiting., Disp: 12 tablet, Rfl: 0   ondansetron (ZOFRAN-ODT)  4 MG disintegrating tablet, Take 4 mg by mouth every 8 (eight) hours as needed for nausea or vomiting., Disp: , Rfl:    oxyCODONE-acetaminophen (PERCOCET/ROXICET) 5-325 MG tablet, Take 1 tablet by mouth every 6 (six) hours as needed for severe pain., Disp: , Rfl:    oxyCODONE-acetaminophen (PERCOCET/ROXICET) 5-325 MG tablet, Take 1 tablet by mouth every 6 (six) hours as needed for up to 8 doses for severe pain., Disp: 6 tablet, Rfl: 0   pramipexole (MIRAPEX) 1.5 MG tablet, Take 1.5 mg by mouth at bedtime., Disp: , Rfl:    pregabalin (LYRICA) 200 MG capsule, Take 1 capsule (200 mg total) by mouth 3 (three) times daily., Disp: 270 capsule, Rfl: 1   promethazine (PHENERGAN) 25 MG tablet, Take 1 tablet (25 mg total) by mouth every 6 (six) hours as needed for nausea or vomiting., Disp: 30 tablet, Rfl: 2   promethazine (PHENERGAN) 25 MG tablet, Take 1 tablet (25 mg total) by mouth every 6 (six) hours as needed for up to 15 doses for nausea or vomiting., Disp: 15 tablet, Rfl: 0   QUEtiapine (SEROQUEL) 200 MG tablet, Take 200-300 mg by mouth See admin  instructions. Take 200 mg at bedtime, may take a second 100 mg dose as needed for sleep, Disp: , Rfl:    Semaglutide-Weight Management (WEGOVY) 0.25 MG/0.5ML SOAJ, Inject 0.25 mg into the skin once a week weeks 1 through 4 of therapy, Disp: 2 mL, Rfl: 9   senna (SENOKOT) 8.6 MG TABS tablet, Take 2 tablets by mouth at bedtime., Disp: , Rfl:    sucralfate (CARAFATE) 1 g tablet, Take 1 tablet (1 g total) by mouth with breakfast, with lunch, and with evening meal for 7 days., Disp: 21 tablet, Rfl: 0  Observations/Objective: Patient is well-developed, well-nourished in no acute distress.  Resting comfortably at home.  Head is normocephalic, atraumatic.  No labored breathing. Speaking in full sentences Speech is clear and coherent with logical content.  Patient is alert and oriented at baseline.   Assessment and Plan: 1. History of changes of urine output  2. History of suprapubic catheter   Based on symptoms and hx of frequent UTIs will cover today for possible UTI Last culture was sensitive to macrobid, bactrim, and cipro.  However, patient reports SE/ allergies to bactrim and cipro.  Will try macrobid.  Instructed to go to the ED if symptoms worsened or did not improve over the next 12 - 24 hours Also requests diflucan for yeast prophylaxis, reviewed medication interactions with current medications on file and instructed to hold current medications while taking diflucan.  DO NOT TAKE WHILE TAKING OXYCODONE, SEROQUEL, AND/OR OMEPRAZOLE.  Also reports having diflucan in the past without adverse effect.   Push fluids and get plenty of rest.   Take antibiotic as directed and to completion Follow up in person at urgent care or go to ER if you have any new or worsening symptoms such as fever, worsening abdominal pain, nausea/vomiting, flank pain, etc...  Follow Up Instructions: I discussed the assessment and treatment plan with the patient. The patient was provided an opportunity to ask questions  and all were answered. The patient agreed with the plan and demonstrated an understanding of the instructions.  A copy of instructions were sent to the patient via MyChart unless otherwise noted below.   The patient was advised to call back or seek an in-person evaluation if the symptoms worsen or if the condition fails to improve as anticipated.  Time:  I spent 20-30 minutes with the patient via telehealth technology discussing the above problems/concerns.    Ashley Harding, PA-C

## 2022-10-03 ENCOUNTER — Telehealth: Payer: Medicare PPO | Admitting: Physician Assistant

## 2022-10-03 DIAGNOSIS — N390 Urinary tract infection, site not specified: Secondary | ICD-10-CM

## 2022-10-03 NOTE — Progress Notes (Signed)
Virtual Visit Consent   Ashley Gentry, you are scheduled for a virtual visit with a [facility] provider today. Just as with appointments in the office, your consent must be obtained to participate. Your consent will be active for this visit and any virtual visit you may have with one of our providers in the next 365 days. If you have a MyChart account, a copy of this consent can be sent to you electronically.  As this is a virtual visit, video technology does not allow for your provider to perform a traditional examination. This may limit your provider's ability to fully assess your condition. If your provider identifies any concerns that need to be evaluated in person or the need to arrange testing (such as labs, EKG, etc.), we will make arrangements to do so. Although advances in technology are sophisticated, we cannot ensure that it will always work on either your end or our end. If the connection with a video visit is poor, the visit may have to be switched to a telephone visit. With either a video or telephone visit, we are not always able to ensure that we have a secure connection.  By engaging in this virtual visit, you consent to the provision of healthcare and authorize for your insurance to be billed (if applicable) for the services provided during this visit. Depending on your insurance coverage, you may receive a charge related to this service.  I need to obtain your verbal consent now. Are you willing to proceed with your visit today? Ashley Gentry has provided verbal consent on 10/03/2022 for a virtual visit (video or telephone). Piedad Climes, [place]  Date: 10/03/2022 8:58 AM  Virtual Visit via Video Note   I, Piedad Climes, connected with  Ashley Gentry  ([id number], 01-Apr-1976) on 10/03/22 at  8:45 AM EDT by a video-enabled telemedicine application and verified that I am speaking with the correct person using two identifiers.  Location: Patient: Virtual  Visit Location Patient: Home Provider: Virtual Visit Location Provider: Home Office   I discussed the limitations of evaluation and management by telemedicine and the availability of in person appointments. The patient expressed understanding and agreed to proceed.    History of Present Illness: Ashley Gentry is a 47 y.o. who identifies as a female who was assigned female at birth, and is being seen today for worsening symptoms after treatment for supsected UTI with Macrobid a few days ago. Has been taking medication as directed with noted initial improvement. Over past 24 hours noting low-grade fever, chills, nausea, suprapubic pain along with her urinary symptoms. Denies flank pain or actual emesis. Has suprapubic catheter in place due to prior injury. Has issue with transportation and mobility. Tried to contact her urology office but had a hard time getting through. Is trying to avoid ER if possible.  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   Insomnia 01/22/2018   T10 spinal cord injury (HCC) 12/25/2017   Paraplegia (HCC) 12/25/2017   Neurogenic bladder 12/25/2017   Neurogenic bowel 12/25/2017   Abdominal distension (gaseous) 11/27/2017   Generalized abdominal pain 11/27/2017   Constipation due  to neurogenic bowel 11/27/2017   Gastroesophageal reflux disease 11/27/2017   NSAID long-term use 11/27/2017    Allergies:  Allergies  Allergen Reactions   Ciprofloxacin     Abdominal Pain, GI intolerance, Hypotension,  Patient stated "made her feel uncomfortable" and lightheaded    Sulfamethoxazole-Trimethoprim Nausea  And Vomiting   Myrbetriq [Mirabegron]     dizziness   Medications:  Current Outpatient Medications:    albuterol (VENTOLIN HFA) 108 (90 Base) MCG/ACT inhaler, Inhale 2 puffs into the lungs every 6 (six) hours as needed for wheezing or shortness of breath., Disp: , Rfl:    amphetamine-dextroamphetamine (ADDERALL XR) 30 MG 24 hr capsule, Take 30 mg by mouth daily., Disp: , Rfl:    bisacodyl (DULCOLAX) 5 MG EC tablet, Take 5 mg by mouth at bedtime as needed for moderate constipation., Disp: , Rfl:    D3-50 1.25 MG (50000 UT) capsule, Take 50,000 Units by mouth every Monday., Disp: , Rfl:    dexmethylphenidate (FOCALIN) 5 MG tablet, Take by mouth., Disp: , Rfl:    dicyclomine (BENTYL) 20 MG tablet, Take 1 tablet (20 mg total) by mouth 2 (two) times daily., Disp: 20 tablet, Rfl: 0   DULoxetine (CYMBALTA) 60 MG capsule, Take 60 mg by mouth daily., Disp: , Rfl:    ibuprofen (ADVIL) 200 MG tablet, Take 400 mg by mouth every 6 (six) hours as needed for moderate pain., Disp: , Rfl:    lamoTRIgine (LAMICTAL) 200 MG tablet, Take 1 tablet (200 mg total) by mouth 2 (two) times daily. (Patient taking differently: Take 200 mg by mouth daily.), Disp: 180 tablet, Rfl: 3   lidocaine (XYLOCAINE) 5 % ointment, Apply 1 application. topically as needed. Apply 1/2 inch to site up to tice a day. (Patient taking differently: Apply 1 application  topically as needed (pain). Apply 1/2 inch to site up to twice a day.), Disp: 35.44 g, Rfl: 0   lithium carbonate 300 MG capsule, Take 300 mg by mouth every evening., Disp: , Rfl:    metFORMIN (GLUCOPHAGE-XR) 500 MG 24 hr tablet, Take 500 mg by mouth 2 (two) times daily., Disp: , Rfl:    methocarbamol (ROBAXIN) 750 MG tablet, Take 750 mg by mouth 2 (two) times daily as needed for muscle spasms., Disp: , Rfl:    nitrofurantoin, macrocrystal-monohydrate, (MACROBID) 100 MG capsule, Take 1 capsule (100 mg total) by mouth 2 (two) times daily for 5 days., Disp: 10 capsule, Rfl: 0    NYSTATIN powder, Apply 1 application  topically 4 (four) times daily as needed (rash)., Disp: , Rfl:    omeprazole (PRILOSEC) 40 MG capsule, Take 40 mg by mouth daily., Disp: , Rfl:    ondansetron (ZOFRAN) 4 MG tablet, Take 4 mg by mouth every 8 (eight) hours as needed for nausea or vomiting., Disp: , Rfl:    ondansetron (ZOFRAN) 4 MG tablet, Take 1 tablet (4 mg total) by mouth every 4 (four) hours as needed for nausea or vomiting., Disp: 12 tablet, Rfl: 0   ondansetron (ZOFRAN-ODT) 4 MG disintegrating tablet, Take 4 mg by mouth every 8 (eight) hours as needed for nausea or vomiting., Disp: , Rfl:    oxyCODONE-acetaminophen (PERCOCET/ROXICET) 5-325 MG tablet, Take 1 tablet by mouth every 6 (six) hours as needed for severe pain., Disp: , Rfl:    oxyCODONE-acetaminophen (PERCOCET/ROXICET) 5-325 MG tablet, Take 1 tablet by mouth every 6 (six) hours as needed for up to 8 doses for severe pain., Disp: 6 tablet, Rfl: 0  pramipexole (MIRAPEX) 1.5 MG tablet, Take 1.5 mg by mouth at bedtime., Disp: , Rfl:    pregabalin (LYRICA) 200 MG capsule, Take 1 capsule (200 mg total) by mouth 3 (three) times daily., Disp: 270 capsule, Rfl: 1   promethazine (PHENERGAN) 25 MG tablet, Take 1 tablet (25 mg total) by mouth every 6 (six) hours as needed for nausea or vomiting., Disp: 30 tablet, Rfl: 2   promethazine (PHENERGAN) 25 MG tablet, Take 1 tablet (25 mg total) by mouth every 6 (six) hours as needed for up to 15 doses for nausea or vomiting., Disp: 15 tablet, Rfl: 0   QUEtiapine (SEROQUEL) 200 MG tablet, Take 200-300 mg by mouth See admin instructions. Take 200 mg at bedtime, may take a second 100 mg dose as needed for sleep, Disp: , Rfl:    Semaglutide-Weight Management (WEGOVY) 0.25 MG/0.5ML SOAJ, Inject 0.25 mg into the skin once a week weeks 1 through 4 of therapy, Disp: 2 mL, Rfl: 9   senna (SENOKOT) 8.6 MG TABS tablet, Take 2 tablets by mouth at bedtime., Disp: , Rfl:    sucralfate (CARAFATE) 1 g tablet, Take 1  tablet (1 g total) by mouth with breakfast, with lunch, and with evening meal for 7 days., Disp: 21 tablet, Rfl: 0  Observations/Objective: Patient is well-developed, well-nourished in no acute distress.  Resting comfortably  at home.  Head is normocephalic, atraumatic.  No labored breathing.  Speech is clear and coherent with logical content.  Patient is alert and oriented at baseline.    Assessment and Plan: 1. Complicated UTI (urinary tract infection)  Recommend ER evaluation due to lack of mobility and need for exam, labs and updated culture. She wants to speak with her urologist first. Discussed I can send a message to make him aware but unsure of his schedule (in office versus OR versus out of office) so if she does not receive a call from the office in the next couple of hours she needs ER evaluation which she agrees to. She would like HH to come out for urine sampling but discussed this is not something we can set up nor is it something that can usually be set up same day without a referral and F2F visit with referring specialist. She is aware we cannot place HH orders.   Follow Up Instructions: I discussed the assessment and treatment plan with the patient. The patient was provided an opportunity to ask questions and all were answered. The patient agreed with the plan and demonstrated an understanding of the instructions.  A copy of instructions were sent to the patient via MyChart unless otherwise noted below.   The patient was advised to call back or seek an in-person evaluation if the symptoms worsen or if the condition fails to improve as anticipated.  Time:  I spent 10 minutes with the patient via telehealth technology discussing the above problems/concerns.    Piedad Climes, PA-C

## 2022-10-03 NOTE — Patient Instructions (Signed)
Ashley Gentry, thank you for joining Piedad Climes, PA-C for today's virtual visit.  While this provider is not your primary care provider (PCP), if your PCP is located in our provider database this encounter information will be shared with them immediately following your visit.   A [facility] MyChart account gives you access to today's visit and all your visits, tests, and labs performed at [facility] " click here if you don't have a [facility] MyChart account or go to [email or web address]  Consent: (Patient) Ashley Gentry provided verbal consent for this virtual visit at the beginning of the encounter.  Current Medications:  Current Outpatient Medications:    albuterol (VENTOLIN HFA) 108 (90 Base) MCG/ACT inhaler, Inhale 2 puffs into the lungs every 6 (six) hours as needed for wheezing or shortness of breath., Disp: , Rfl:    amphetamine-dextroamphetamine (ADDERALL XR) 30 MG 24 hr capsule, Take 30 mg by mouth daily., Disp: , Rfl:    bisacodyl (DULCOLAX) 5 MG EC tablet, Take 5 mg by mouth at bedtime as needed for moderate constipation., Disp: , Rfl:    D3-50 1.25 MG (50000 UT) capsule, Take 50,000 Units by mouth every Monday., Disp: , Rfl:    dexmethylphenidate (FOCALIN) 5 MG tablet, Take by mouth., Disp: , Rfl:    dicyclomine (BENTYL) 20 MG tablet, Take 1 tablet (20 mg total) by mouth 2 (two) times daily., Disp: 20 tablet, Rfl: 0   DULoxetine (CYMBALTA) 60 MG capsule, Take 60 mg by mouth daily., Disp: , Rfl:    ibuprofen (ADVIL) 200 MG tablet, Take 400 mg by mouth every 6 (six) hours as needed for moderate pain., Disp: , Rfl:    lamoTRIgine (LAMICTAL) 200 MG tablet, Take 1 tablet (200 mg total) by mouth 2 (two) times daily. (Patient taking differently: Take 200 mg by mouth daily.), Disp: 180 tablet, Rfl: 3   lidocaine (XYLOCAINE) 5 % ointment, Apply 1 application. topically as needed. Apply 1/2 inch to site up to tice a day. (Patient taking differently:  Apply 1 application  topically as needed (pain). Apply 1/2 inch to site up to twice a day.), Disp: 35.44 g, Rfl: 0   lithium carbonate 300 MG capsule, Take 300 mg by mouth every evening., Disp: , Rfl:    metFORMIN (GLUCOPHAGE-XR) 500 MG 24 hr tablet, Take 500 mg by mouth 2 (two) times daily., Disp: , Rfl:    methocarbamol (ROBAXIN) 750 MG tablet, Take 750 mg by mouth 2 (two) times daily as needed for muscle spasms., Disp: , Rfl:    nitrofurantoin, macrocrystal-monohydrate, (MACROBID) 100 MG capsule, Take 1 capsule (100 mg total) by mouth 2 (two) times daily for 5 days., Disp: 10 capsule, Rfl: 0   NYSTATIN powder, Apply 1 application  topically 4 (four) times daily as needed (rash)., Disp: , Rfl:    omeprazole (PRILOSEC) 40 MG capsule, Take 40 mg by mouth daily., Disp: , Rfl:    ondansetron (ZOFRAN) 4 MG tablet, Take 4 mg by mouth every 8 (eight) hours as needed for nausea or vomiting., Disp: , Rfl:    ondansetron (ZOFRAN) 4 MG tablet, Take 1 tablet (4 mg total) by mouth every 4 (four) hours as needed for nausea or vomiting., Disp: 12 tablet, Rfl: 0   ondansetron (ZOFRAN-ODT) 4 MG disintegrating tablet, Take 4 mg by mouth every 8 (eight) hours as needed for nausea or vomiting., Disp: , Rfl:    oxyCODONE-acetaminophen (PERCOCET/ROXICET) 5-325 MG tablet, Take 1 tablet by mouth every 6 (six)  hours as needed for severe pain., Disp: , Rfl:    oxyCODONE-acetaminophen (PERCOCET/ROXICET) 5-325 MG tablet, Take 1 tablet by mouth every 6 (six) hours as needed for up to 8 doses for severe pain., Disp: 6 tablet, Rfl: 0   pramipexole (MIRAPEX) 1.5 MG tablet, Take 1.5 mg by mouth at bedtime., Disp: , Rfl:    pregabalin (LYRICA) 200 MG capsule, Take 1 capsule (200 mg total) by mouth 3 (three) times daily., Disp: 270 capsule, Rfl: 1   promethazine (PHENERGAN) 25 MG tablet, Take 1 tablet (25 mg total) by mouth every 6 (six) hours as needed for nausea or vomiting., Disp: 30 tablet, Rfl: 2   promethazine (PHENERGAN) 25 MG  tablet, Take 1 tablet (25 mg total) by mouth every 6 (six) hours as needed for up to 15 doses for nausea or vomiting., Disp: 15 tablet, Rfl: 0   QUEtiapine (SEROQUEL) 200 MG tablet, Take 200-300 mg by mouth See admin instructions. Take 200 mg at bedtime, may take a second 100 mg dose as needed for sleep, Disp: , Rfl:    Semaglutide-Weight Management (WEGOVY) 0.25 MG/0.5ML SOAJ, Inject 0.25 mg into the skin once a week weeks 1 through 4 of therapy, Disp: 2 mL, Rfl: 9   senna (SENOKOT) 8.6 MG TABS tablet, Take 2 tablets by mouth at bedtime., Disp: , Rfl:    sucralfate (CARAFATE) 1 g tablet, Take 1 tablet (1 g total) by mouth with breakfast, with lunch, and with evening meal for 7 days., Disp: 21 tablet, Rfl: 0   Medications ordered in this encounter:  No orders of the defined types were placed in this encounter.    *If you need refills on other medications prior to your next appointment, please contact your pharmacy*  Follow-Up: Call back or seek an in-person evaluation if the symptoms worsen or if the condition fails to improve as anticipated.  [facility] [phone]  Other Instructions As discussed, you need an ER evaluation if not contacted by your specialist office with other instructions in the next few hours. PLEASE DO NOT DELAY CARE.   If you have been instructed to have an in-person evaluation today at a local Urgent Care facility, please use the link below. It will take you to a list of all of our available [facility], including address, phone number and hours of operation. Please do not delay care.  [facility]  If you or a family member do not have a primary care provider, use the link below to schedule a visit and establish care. When you choose a [facility] primary care physician or advanced practice provider, you gain a long-term partner in health. Find a Primary Care Provider  Learn more about [facility]'s in-office and virtual  care options: [facility] - Get Care Now

## 2022-12-06 ENCOUNTER — Telehealth: Payer: Self-pay | Admitting: Neurology

## 2022-12-06 ENCOUNTER — Other Ambulatory Visit: Payer: Self-pay | Admitting: Neurology

## 2022-12-06 ENCOUNTER — Other Ambulatory Visit: Payer: Self-pay

## 2022-12-06 NOTE — Telephone Encounter (Signed)
Pt states she recently broke her leg and is requesting home health re: bathing, changing her sheets and catheter.  [facility] was suggested.  Pt is asking for a call to discuss this request.

## 2022-12-06 NOTE — Telephone Encounter (Addendum)
Pt is requesting a refill for  pregabalin (LYRICA) 200 MG capsule. And  DULoxetine (CYMBALTA) 60 MG capsule,  Pharmacy: [facility] [phone]

## 2022-12-10 MED ORDER — PREGABALIN 200 MG PO CAPS: 200.0000 mg | ORAL_CAPSULE | Freq: Three times a day (TID) | ORAL | 1 refills | Status: DC

## 2022-12-10 NOTE — Telephone Encounter (Signed)
Phone room: Please advise p of sater recommendation to discusss w/pcp  Sater, Pearletha Furl, MD  29 minutes ago (9:01 AM)    Since this is primarily due to the leg fracture would be best for her to discuss with primary care.

## 2022-12-10 NOTE — Addendum Note (Signed)
Addended by: Aura Camps on: 12/10/2022 07:32 AM   Modules accepted: Orders

## 2022-12-10 NOTE — Telephone Encounter (Addendum)
Last seen on 08/16/22 Follow up scheduled on 02/21/23 Ashley Gentry last filled on 10/17/22 #90 tablets (30 day supply) Rx pending to be signed

## 2023-02-08 ENCOUNTER — Other Ambulatory Visit: Payer: Self-pay | Admitting: Nurse Practitioner

## 2023-02-08 DIAGNOSIS — Z1231 Encounter for screening mammogram for malignant neoplasm of breast: Secondary | ICD-10-CM

## 2023-02-20 ENCOUNTER — Telehealth: Payer: Self-pay | Admitting: Neurology

## 2023-02-20 NOTE — Telephone Encounter (Signed)
Pt had to cancel appt for tomorrow 11/21 with Dr. Epimenio Foot because she has no transportation. She is asking if she can schedule a virtual visit with him

## 2023-02-20 NOTE — Telephone Encounter (Signed)
Called pt back. Advised ok per MD to do VV for tomorrow. R/s appt 02/21/23 at 1:00pm with Dr. Epimenio Foot as mychart VV.   Per Dr. Epimenio Foot, future appt will need to be in person since last in person 07/2021

## 2023-02-21 ENCOUNTER — Telehealth (INDEPENDENT_AMBULATORY_CARE_PROVIDER_SITE_OTHER): Payer: Medicare PPO | Admitting: Neurology

## 2023-02-21 ENCOUNTER — Other Ambulatory Visit: Payer: Self-pay | Admitting: *Deleted

## 2023-02-21 ENCOUNTER — Encounter: Payer: Self-pay | Admitting: Neurology

## 2023-02-21 ENCOUNTER — Ambulatory Visit: Payer: Medicare PPO | Admitting: Neurology

## 2023-02-21 DIAGNOSIS — N319 Neuromuscular dysfunction of bladder, unspecified: Secondary | ICD-10-CM

## 2023-02-21 DIAGNOSIS — S24103S Unspecified injury at T7-T10 level of thoracic spinal cord, sequela: Secondary | ICD-10-CM

## 2023-02-21 DIAGNOSIS — K592 Neurogenic bowel, not elsewhere classified: Secondary | ICD-10-CM

## 2023-02-21 DIAGNOSIS — G8220 Paraplegia, unspecified: Secondary | ICD-10-CM

## 2023-02-21 DIAGNOSIS — R208 Other disturbances of skin sensation: Secondary | ICD-10-CM

## 2023-02-21 DIAGNOSIS — R252 Cramp and spasm: Secondary | ICD-10-CM

## 2023-02-21 DIAGNOSIS — R202 Paresthesia of skin: Secondary | ICD-10-CM

## 2023-02-21 DIAGNOSIS — F418 Other specified anxiety disorders: Secondary | ICD-10-CM

## 2023-02-21 DIAGNOSIS — G894 Chronic pain syndrome: Secondary | ICD-10-CM

## 2023-02-21 DIAGNOSIS — G959 Disease of spinal cord, unspecified: Secondary | ICD-10-CM

## 2023-02-21 MED ORDER — GABAPENTIN 800 MG PO TABS: 800.0000 mg | ORAL_TABLET | Freq: Three times a day (TID) | ORAL | 5 refills | Status: AC
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
## 2023-02-25 ENCOUNTER — Telehealth: Payer: Self-pay | Admitting: Neurology

## 2023-02-25 NOTE — Telephone Encounter (Signed)
Referral for neurosurgery fax to [facility]. Phone: [phone], Fax:[phone]

## 2023-02-25 NOTE — Telephone Encounter (Signed)
Below noted.

## 2023-06-06 ENCOUNTER — Other Ambulatory Visit: Payer: Self-pay | Admitting: Neurology

## 2023-06-06 ENCOUNTER — Telehealth: Payer: Self-pay | Admitting: Neurology

## 2023-06-06 DIAGNOSIS — S24103S Unspecified injury at T7-T10 level of thoracic spinal cord, sequela: Secondary | ICD-10-CM

## 2023-06-06 DIAGNOSIS — R208 Other disturbances of skin sensation: Secondary | ICD-10-CM

## 2023-06-06 NOTE — Telephone Encounter (Signed)
 Called pt and relayed info.   She reports she is getting headaches when sitting up along with dizziness. She is very fatigued also. able to fall asleep right away. this is new. She is wondering if mri brain should also be done?   Aware to go to UC or ER if sx worsen or are severe.

## 2023-06-06 NOTE — Telephone Encounter (Signed)
 Pt called stating that she is needing an MRI of the Spine. Please advise.Marland Kitchen

## 2023-06-06 NOTE — Telephone Encounter (Signed)
 Pt last seen 02/21/23. Has no f/u scheduled. Dr. Epimenio Foot wanted to see her back around 08/21/23 (6 month f/u). Reviewed EPIC records, last MRI thoracic/cervical 08/04/21 done at [facility].  I called pt at [phone].  Pt reports she stretched left wrist 3 days ago, and noticed it wasausing pain.For past 2 days, noticed swelling on L side of body, throbbing pain in lower middle back (lumbar area--near hips). Having more paralysis on L side (more than normal). L Breast, hip, looks larger than normal.   Has severe UTI currently. Hhas appt for this tomorrow virtually w/ PA with [facility] (PCP). Has indwelling catheter. Homebound now. Has colostomy. Has pressure sore on L buttock, doing wound care for this.   Wants cervical, thoracic, lumbar MRI to update imaging. Does not want to go to ER. Ok to go to [facility] imaging. Able to transfer, caregiver will help. Aware I will send to Dr. Epimenio Foot to review.

## 2023-06-10 NOTE — Telephone Encounter (Signed)
 Called pt. Relayed Dr. Bonnita Hollow message. She verbalized understanding.   She asked for recommendation for GI doctor. Provided her below recommendation. She will call to inquire about making appt.

## 2023-06-19 ENCOUNTER — Telehealth: Payer: Self-pay | Admitting: Neurology

## 2023-06-19 NOTE — Telephone Encounter (Signed)
 No auth required sent to [facility] since she uses a wheelchair. [phone]

## 2023-06-24 NOTE — Telephone Encounter (Signed)
 [employer] medicare Berkley Harvey: [id number] exp. 06/24/23-07/23/23 for [facility].

## 2023-06-25 ENCOUNTER — Ambulatory Visit (HOSPITAL_COMMUNITY): Admission: RE | Admit: 2023-06-25 | Payer: Self-pay | Source: Ambulatory Visit

## 2023-06-25 ENCOUNTER — Ambulatory Visit (HOSPITAL_COMMUNITY): Payer: Self-pay | Attending: Neurology

## 2023-06-25 ENCOUNTER — Other Ambulatory Visit: Payer: Self-pay | Admitting: Neurology

## 2023-06-25 ENCOUNTER — Telehealth: Payer: Self-pay | Admitting: Neurology

## 2023-06-25 MED ORDER — PREGABALIN 200 MG PO CAPS: 200.0000 mg | ORAL_CAPSULE | Freq: Three times a day (TID) | ORAL | 1 refills | Status: AC

## 2023-06-25 NOTE — Telephone Encounter (Signed)
 I spoke to the patient and wrote a note on the other telephone note regarding this.

## 2023-06-25 NOTE — Telephone Encounter (Signed)
 Pt no showed MRI's today. Tori, can you call pt back and confirm they are approved and that she needs to r/s?

## 2023-06-25 NOTE — Telephone Encounter (Signed)
 Pt asking for an appointment before October. Complications with left side of back numbness, issue with bowels in relation to numbness in back, issue with colostomy. Was advised by PCP to see my neurologist. Would like to schedule a VV earlier than October.

## 2023-06-25 NOTE — Telephone Encounter (Signed)
 I called the patient to give her information about rescheduling her MRIs. She said she was waiting for a call back from [facility] from this morning and she would like Dr. Epimenio Foot to schedule her a CT of her chest and abdomen to look at the issues listed below. She has trouble getting around and wanted to reschedule her MRIs for when she can also schedule the CT scans for the same day. She said that Kara Mead was also going to schedule her a virtual visit with Dr. Epimenio Foot so she can discuss all this and get all her scans straight. She said that she is also wondering if her MRIs should be done with contrast so they can get the best look at everything. I told her that once all the scans are in order in her chart I will ask the hospital to call her to schedule and I gave her their phone number so she has it.

## 2023-06-25 NOTE — Telephone Encounter (Signed)
 MRI'S have been order by Dr.Sater.

## 2023-06-25 NOTE — Telephone Encounter (Addendum)
 Pt had MRI lumbar, cervical, thoracic scheduled at [facility] today but no showed.    I spoke w/ pt on the phone. Having worsening numbness left side/left nipple and colostomy issues. Numbness in nipple new for her and she is concerned about this.She had transportation/care giver issues for today. Did not have care giver to help her get transferred. Called to r/s MRI's asap.  She notified [facility] (PCP) that she was having more paralysis/numb/colostomy issues and they directed her to speak with Dr. Epimenio Foot about further imaging to evaluate bowel/bladder issues.   GI specialist denied referral. Recommended she see colorectal specialist instead and she has appt on 07/04/23. Having issues with colostomy/bladder.  Need refill on pregabalin. Aware I will send this to Dr. Epimenio Foot. However, when I checked last refills, she has been filling through Ester Rink, FNP since 03/27/24.   Aware I will speak with MD and call her back.

## 2023-06-25 NOTE — Telephone Encounter (Signed)
 Pt calling for clarity about radiology referrals. Asking what each referral covers. Would like a call back.

## 2023-07-02 ENCOUNTER — Emergency Department (HOSPITAL_COMMUNITY)

## 2023-07-02 ENCOUNTER — Emergency Department (HOSPITAL_COMMUNITY)
Admission: EM | Admit: 2023-07-02 | Discharge: 2023-07-03 | Disposition: A | Discharge status: Home / Self Care | Attending: Emergency Medicine | Admitting: Emergency Medicine

## 2023-07-02 ENCOUNTER — Other Ambulatory Visit: Payer: Self-pay

## 2023-07-02 DIAGNOSIS — R000 Tachycardia, unspecified: Secondary | ICD-10-CM | POA: Diagnosis not present

## 2023-07-02 DIAGNOSIS — Z933 Colostomy status: Secondary | ICD-10-CM | POA: Diagnosis not present

## 2023-07-02 DIAGNOSIS — Z79899 Other long term (current) drug therapy: Secondary | ICD-10-CM | POA: Diagnosis not present

## 2023-07-02 DIAGNOSIS — R140 Abdominal distension (gaseous): Secondary | ICD-10-CM | POA: Diagnosis not present

## 2023-07-02 DIAGNOSIS — R0789 Other chest pain: Secondary | ICD-10-CM | POA: Diagnosis not present

## 2023-07-02 DIAGNOSIS — R200 Anesthesia of skin: Secondary | ICD-10-CM

## 2023-07-02 DIAGNOSIS — G8221 Paraplegia, complete: Secondary | ICD-10-CM | POA: Diagnosis not present

## 2023-07-02 DIAGNOSIS — R109 Unspecified abdominal pain: Secondary | ICD-10-CM | POA: Diagnosis not present

## 2023-07-02 DIAGNOSIS — R739 Hyperglycemia, unspecified: Secondary | ICD-10-CM | POA: Insufficient documentation

## 2023-07-02 LAB — CBC
HCT: 44.1 % (ref 36.0–46.0)
Hemoglobin: 14.2 g/dL (ref 12.0–15.0)
MCH: 29.3 pg (ref 26.0–34.0)
MCHC: 32.2 g/dL (ref 30.0–36.0)
MCV: 90.9 fL (ref 80.0–100.0)
Platelets: 239 10*3/uL (ref 150–400)
RBC: 4.85 MIL/uL (ref 3.87–5.11)
RDW: 14.3 % (ref 11.5–15.5)
WBC: 9.4 10*3/uL (ref 4.0–10.5)
nRBC: 0 % (ref 0.0–0.2)

## 2023-07-02 LAB — TROPONIN I (HIGH SENSITIVITY)
Troponin I (High Sensitivity): <2 ng/L (ref <18)
Troponin I (High Sensitivity): 6 ng/L (ref <18)

## 2023-07-02 LAB — I-STAT CHEM 8, ED
BUN: 11 mg/dL (ref 6–20)
Calcium, Ion: 1.11 mmol/L — ABNORMAL LOW (ref 1.15–1.40)
Chloride: 98 mmol/L (ref 98–111)
Creatinine, Ser: 0.3 mg/dL — ABNORMAL LOW (ref 0.44–1.00)
Glucose, Bld: 363 mg/dL — ABNORMAL HIGH (ref 70–99)
HCT: 44 % (ref 36.0–46.0)
Hemoglobin: 15 g/dL (ref 12.0–15.0)
Potassium: 4.3 mmol/L (ref 3.5–5.1)
Sodium: 134 mmol/L — ABNORMAL LOW (ref 135–145)
TCO2: 29 mmol/L (ref 22–32)

## 2023-07-02 LAB — URINALYSIS, W/ REFLEX TO CULTURE (INFECTION SUSPECTED)
Bilirubin Urine: NEGATIVE
Glucose, UA: >=500 mg/dL — AB
Hgb urine dipstick: NEGATIVE
Ketones, ur: NEGATIVE mg/dL
Nitrite: NEGATIVE
Protein, ur: NEGATIVE mg/dL
Specific Gravity, Urine: 1.025 (ref 1.005–1.030)
pH: 7 (ref 5.0–8.0)

## 2023-07-02 LAB — DIFFERENTIAL
Abs Immature Granulocytes: 0.06 10*3/uL (ref 0.00–0.07)
Basophils Absolute: 0.1 10*3/uL (ref 0.0–0.1)
Basophils Relative: 1 %
Eosinophils Absolute: 0.6 10*3/uL — ABNORMAL HIGH (ref 0.0–0.5)
Eosinophils Relative: 6 %
Immature Granulocytes: 1 %
Lymphocytes Relative: 20 %
Lymphs Abs: 1.9 10*3/uL (ref 0.7–4.0)
Monocytes Absolute: 0.4 10*3/uL (ref 0.1–1.0)
Monocytes Relative: 4 %
Neutro Abs: 6.4 10*3/uL (ref 1.7–7.7)
Neutrophils Relative %: 68 %

## 2023-07-02 LAB — APTT: aPTT: 29 s (ref 24–36)

## 2023-07-02 LAB — COMPREHENSIVE METABOLIC PANEL WITH GFR
ALT: 69 U/L — ABNORMAL HIGH (ref 0–44)
AST: 116 U/L — ABNORMAL HIGH (ref 15–41)
Albumin: 3.4 g/dL — ABNORMAL LOW (ref 3.5–5.0)
Alkaline Phosphatase: 114 U/L (ref 38–126)
Anion gap: 12 (ref 5–15)
BUN: 9 mg/dL (ref 6–20)
CO2: 23 mmol/L (ref 22–32)
Calcium: 9.2 mg/dL (ref 8.9–10.3)
Chloride: 100 mmol/L (ref 98–111)
Creatinine, Ser: 0.43 mg/dL — ABNORMAL LOW (ref 0.44–1.00)
GFR, Estimated: >60 mL/min (ref >60)
Glucose, Bld: 365 mg/dL — ABNORMAL HIGH (ref 70–99)
Potassium: 4.3 mmol/L (ref 3.5–5.1)
Sodium: 135 mmol/L (ref 135–145)
Total Bilirubin: 0.8 mg/dL (ref 0.0–1.2)
Total Protein: 6.8 g/dL (ref 6.5–8.1)

## 2023-07-02 LAB — PROTIME-INR
INR: 1 (ref 0.8–1.2)
Prothrombin Time: 13.3 s (ref 11.4–15.2)

## 2023-07-02 LAB — CBG MONITORING, ED: Glucose-Capillary: 365 mg/dL — ABNORMAL HIGH (ref 70–99)

## 2023-07-02 LAB — ETHANOL: Alcohol, Ethyl (B): <10 mg/dL (ref <10)

## 2023-07-02 MED ORDER — SODIUM CHLORIDE 0.9 % IV BOLUS
1000.0000 mL | Freq: Once | INTRAVENOUS | Status: AC
  Administered 2023-07-02: 1000 mL via INTRAVENOUS

## 2023-07-02 MED ORDER — IOHEXOL 350 MG/ML SOLN
75.0000 mL | Freq: Once | INTRAVENOUS | Status: AC | PRN
Start: 2023-07-02 — End: 2023-07-02
  Administered 2023-07-02: 75 mL via INTRAVENOUS

## 2023-07-02 MED ORDER — ONDANSETRON HCL 4 MG/2ML IJ SOLN
4.0000 mg | Freq: Once | INTRAMUSCULAR | Status: AC
  Administered 2023-07-02: 4 mg via INTRAVENOUS
  Filled 2023-07-02: qty 2

## 2023-07-02 MED ORDER — SODIUM CHLORIDE 0.9% FLUSH
3.0000 mL | Freq: Once | INTRAVENOUS | Status: AC
  Administered 2023-07-02: 3 mL via INTRAVENOUS

## 2023-07-02 NOTE — ED Notes (Signed)
 Patient transported to MRI

## 2023-07-02 NOTE — Code Documentation (Signed)
 Stroke Response Nurse Documentation Code Documentation  Ashley Gentry is a 48 y.o. 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 On No antithrombotic. Code stroke was activated by EMS.   Patient from home where she was LKW at 1100  and now complaining of left sensory loss. Per report, she felt a "pop" while transferring to chair, after which, she was had left sided sensation loss.  Stroke team at the bedside on patient arrival. Labs drawn and patient cleared for CT by EDP.  NIHSS 9. , see documentation for details and code stroke times. Patient with left decreased sensation on exam. On further discussion, she has had left sided numbness since 06/25/23.  Patient is not a candidate for IV Thrombolytic due to OOW, stroke not suspected.. Patient is not a candidate for IR due to no VAN negative, stroke not suspected. Code stroke cancelled at 1232.    Bedside handoff with ED RN Seward Grater.    Argentina Donovan  Stroke Response RN

## 2023-07-02 NOTE — ED Provider Notes (Signed)
 [place] EMERGENCY DEPARTMENT AT [facility] Provider Note   CSN: [id number] Arrival date & time: 07/02/23  1223  An emergency department physician performed an initial assessment on this suspected stroke patient at 25.  History  No chief complaint on file.   Deshundra Waller is a 48 y.o. female.  HPI 48 year old female with a history of a T10 spinal cord injury presents with acute left sided sensation changes. She was activated as a code stroke by EMS.  She has chronic paraplegia, an ostomy, and suprapubic catheter.  She recently got over a UTI where she was treated with Levaquin, finishing about a week ago.  She states she still has not quite felt right.  She has on and off left-sided neurosymptoms but today when someone was transferring her from her bed to her wheelchair she felt like there was a popping sensation and the left side of her arm and chest does not feel right.  Her abdomen and chest feel like they are drooping on the left side and her left arm feels numb compared to the other side.  She felt like her lips felt the same but not really her face.  No headache or new neck pain.  She does feel short of breath since this happened.  No chest pain, cough, fever. Her abdomen is hurting. She has not had any output from her ostomy today.  Home Medications Prior to Admission medications   Medication Sig Start Date End Date Taking? Authorizing Provider  albuterol (VENTOLIN HFA) 108 (90 Base) MCG/ACT inhaler Inhale 2 puffs into the lungs every 6 (six) hours as needed for wheezing or shortness of breath.    [provider]  amphetamine-dextroamphetamine (ADDERALL XR) 30 MG 24 hr capsule Take 30 mg by mouth daily.    [provider]  bisacodyl (DULCOLAX) 5 MG EC tablet Take 5 mg by mouth at bedtime as needed for moderate constipation.    [provider]  D3-50 1.25 MG (50000 UT) capsule Take 50,000 Units by mouth every Monday. 11/24/20   [provider]  dexmethylphenidate (FOCALIN) 5 MG tablet Take by mouth. 12/21/19   [provider]  dicyclomine (BENTYL) 20 MG tablet Take 1 tablet (20 mg total) by mouth 2 (two) times daily. 08/17/22   Sloan Leiter, DO  DULoxetine (CYMBALTA) 60 MG capsule Take 60 mg by mouth daily. 06/19/21   [provider]  gabapentin (NEURONTIN) 800 MG tablet Take 1 tablet (800 mg total) by mouth 3 (three) times daily. 02/21/23   Sater, Pearletha Furl, MD  ibuprofen (ADVIL) 200 MG tablet Take 400 mg by mouth every 6 (six) hours as needed for moderate pain.    [provider]  lamoTRIgine (LAMICTAL) 200 MG tablet Take 1 tablet (200 mg total) by mouth 2 (two) times daily. Patient taking differently: Take 200 mg by mouth daily. 03/14/21   Sater, Pearletha Furl, MD  lidocaine (XYLOCAINE) 5 % ointment Apply 1 application. topically as needed. Apply 1/2 inch to site up to tice a day. Patient taking differently: Apply 1 application  topically as needed (pain). Apply 1/2 inch to site up to twice a day. 07/18/21   Sater, Pearletha Furl, MD  lithium carbonate 300 MG capsule Take 300 mg by mouth every evening.    [provider]  metFORMIN (GLUCOPHAGE-XR) 500 MG 24 hr tablet Take 500 mg by mouth 2 (two) times daily. 04/10/22   [provider]  methocarbamol (ROBAXIN) 750 MG  tablet Take 750 mg by mouth 2 (two) times daily as needed for muscle spasms.    [provider]  NYSTATIN powder Apply 1 application  topically 4 (four) times daily as needed (rash). 01/27/21   [provider]  omeprazole (PRILOSEC) 40 MG capsule Take 40 mg by mouth daily.    [provider]  ondansetron (ZOFRAN) 4 MG tablet Take 4 mg by mouth every 8 (eight) hours as needed for nausea or vomiting.    [provider]  ondansetron (ZOFRAN) 4 MG tablet Take 1 tablet (4 mg total) by mouth every 4 (four) hours as needed for nausea or vomiting. 08/17/22   Tanda Rockers A, DO  ondansetron  (ZOFRAN-ODT) 4 MG disintegrating tablet Take 4 mg by mouth every 8 (eight) hours as needed for nausea or vomiting.    [provider]  oxyCODONE-acetaminophen (PERCOCET/ROXICET) 5-325 MG tablet Take 1 tablet by mouth every 6 (six) hours as needed for severe pain.    [provider]  oxyCODONE-acetaminophen (PERCOCET/ROXICET) 5-325 MG tablet Take 1 tablet by mouth every 6 (six) hours as needed for up to 8 doses for severe pain. 08/20/22   Jeanelle Malling, PA  pramipexole (MIRAPEX) 1.5 MG tablet Take 1.5 mg by mouth at bedtime.    [provider]  pregabalin (LYRICA) 200 MG capsule Take 1 capsule (200 mg total) by mouth 3 (three) times daily. 06/25/23   Sater, Pearletha Furl, MD  promethazine (PHENERGAN) 25 MG tablet Take 1 tablet (25 mg total) by mouth every 6 (six) hours as needed for nausea or vomiting. 11/23/20   York Spaniel, MD  promethazine (PHENERGAN) 25 MG tablet Take 1 tablet (25 mg total) by mouth every 6 (six) hours as needed for up to 15 doses for nausea or vomiting. 08/20/22   Jeanelle Malling, PA  QUEtiapine (SEROQUEL) 200 MG tablet Take 200-300 mg by mouth See admin instructions. Take 200 mg at bedtime, may take a second 100 mg dose as needed for sleep    [provider]  Semaglutide-Weight Management (WEGOVY) 0.25 MG/0.5ML SOAJ Inject 0.25 mg into the skin once a week weeks 1 through 4 of therapy 01/15/22     senna (SENOKOT) 8.6 MG TABS tablet Take 2 tablets by mouth at bedtime.    [provider]  sucralfate (CARAFATE) 1 g tablet Take 1 tablet (1 g total) by mouth with breakfast, with lunch, and with evening meal for 7 days. 08/17/22 08/24/22  Sloan Leiter, DO      Allergies    Ciprofloxacin, Sulfamethoxazole-trimethoprim, and Myrbetriq [mirabegron]    Review of Systems   Review of Systems  Constitutional:  Negative for fever.  Respiratory:  Positive for shortness of breath. Negative for cough.   Gastrointestinal:  Positive for abdominal distention,  abdominal pain and constipation.  Neurological:  Positive for numbness. Negative for headaches.    Physical Exam Updated Vital Signs BP 125/65   Pulse (!) 110   Temp 97.9 F (36.6 C) (Oral)   Resp 20   SpO2 98%  Physical Exam Vitals and nursing note reviewed.  Constitutional:      General: She is not in acute distress.    Appearance: She is well-developed. She is not ill-appearing or diaphoretic.     Interventions: Cervical collar in place.  HENT:     Head: Normocephalic and atraumatic.  Cardiovascular:     Rate and Rhythm: Regular rhythm. Tachycardia present.     Heart sounds: Normal heart sounds.  Pulmonary:     Effort: Pulmonary effort is normal.     Breath sounds: Normal breath sounds. No wheezing.  Abdominal:     General: There is distension.     Palpations: Abdomen is soft.     Tenderness: There is abdominal tenderness.     Comments: Suprapubic catheter without signs of redness. Ostomy site appears normal as well.  Skin:    General: Skin is warm and dry.  Neurological:     Mental Status: She is alert.     Comments: Patient has paralyzed legs. She has weakness but is symmetric to both upper extremities. No facial droop or slurred speech. She feels decreased sensation in the left arm compared to the right.     ED Results / Procedures / Treatments   Labs (all labs ordered are listed, but only abnormal results are displayed) Labs Reviewed  DIFFERENTIAL - Abnormal; Notable for the following components:      Result Value   Eosinophils Absolute 0.6 (*)    All other components within normal limits  COMPREHENSIVE METABOLIC PANEL WITH GFR - Abnormal; Notable for the following components:   Glucose, Bld 365 (*)    Creatinine, Ser 0.43 (*)    Albumin 3.4 (*)    AST 116 (*)    ALT 69 (*)    All other components within normal limits  I-STAT CHEM 8, ED - Abnormal; Notable for the following components:   Sodium 134 (*)    Creatinine, Ser 0.30 (*)    Glucose, Bld 363 (*)     Calcium, Ion 1.11 (*)    All other components within normal limits  CBG MONITORING, ED - Abnormal; Notable for the following components:   Glucose-Capillary 365 (*)    All other components within normal limits  PROTIME-INR  APTT  CBC  ETHANOL  URINALYSIS, W/ REFLEX TO CULTURE (INFECTION SUSPECTED)  TROPONIN I (HIGH SENSITIVITY)  TROPONIN I (HIGH SENSITIVITY)    EKG EKG Interpretation Date/Time:  Tuesday July 02 2023 12:40:39 EDT Ventricular Rate:  109 PR Interval:  159 QRS Duration:  75 QT Interval:  339 QTC Calculation: 457 R Axis:   82  Text Interpretation: Sinus tachycardia Nonspecific T abnrm, anterolateral leads similar to Jan 2024 Confirmed by Pricilla Loveless [id number]) on 07/02/2023 2:31:50 PM  Radiology No results found.  Procedures Procedures    Medications Ordered in ED Medications  sodium chloride flush (NS) 0.9 % injection 3 mL (3 mLs Intravenous Given 07/02/23 1302)  sodium chloride 0.9 % bolus 1,000 mL (0 mLs Intravenous Stopped 07/02/23 1516)  iohexol (OMNIPAQUE) 350 MG/ML injection 75 mL (75 mLs Intravenous Contrast Given 07/02/23 1426)    ED Course/ Medical Decision Making/ A&P                                 Medical Decision Making Amount and/or Complexity of Data Reviewed Labs: ordered.    Details: Hyperglycemia.  This Radiology: ordered. ECG/medicine tests: ordered and independent interpretation performed.    Details: Sinus tachycardia  Risk Prescription drug management.   Neuro canceled code stroke.  They are recommending MRI of C/T/L-spine.  These have been ordered.  Given patient's abdominal complaints a CT has also been ordered.  Care transferred to Dr. Wilkie Aye.        Final Clinical Impression(s) / ED Diagnoses Final diagnoses:  None    Rx / DC Orders ED Discharge Orders  None         Pricilla Loveless, MD 07/02/23 [id number]
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
## 2023-07-02 NOTE — ED Triage Notes (Signed)
 PT BIB [facility] - originally code stroke, cancelled @ 1229. Pt paraplegic. Pt was transferring from chair to bed and felt twist/pop on L side. This happened at 11AM today.   160/90 105 HR 96 RA 431 CBG  20G R forearm 4mg  zofran given

## 2023-07-02 NOTE — ED Notes (Signed)
 Pt admitted to [facility] spoke with tech [place]

## 2023-07-02 NOTE — ED Notes (Signed)
 Please give call to Gretel Acre [phone], he said he's "transporting her home"

## 2023-07-03 NOTE — ED Notes (Signed)
 Pt has removed all monitoring equipment and wires

## 2023-07-03 NOTE — ED Provider Notes (Signed)
 Patient signed out to me by previous provider. Please refer to their note for full HPI.  Briefly this is a 48 year old female with previous spinal injury and chronic paraplegia, ostomy who presented with acute on sided left-sided sensory changes.  Initially seen as a code stroke but neurology canceled this and focus more on MRI spine imaging.  Sent patient signed out pending this imaging.  MRI of the cervical, thoracic and lumbar spine show chronic hyperintense findings at T3/4.  Thought to be myelomalacia.  Spoke with neurology and neurosurgery who did not feel that this is an acute finding or contributing to her change in symptoms at this time.  Will plan for the patient to establish care with a spine doctor as an outpatient for further care.  Patient at this time appears safe and stable for discharge and close outpatient follow up. Discharge plan and strict return to ED precautions discussed, patient verbalizes understanding and agreement.   Rozelle Logan, DO 07/03/23 0003

## 2023-07-03 NOTE — Discharge Instructions (Signed)
 You have been seen and discharged from the emergency department.  Please establish care with neurosurgery to follow-up chronic MRI findings and symptoms.  Follow-up with your primary provider for further evaluation and further care. Take home medications as prescribed. If you have any worsening symptoms or further concerns for your health please return to an emergency department for further evaluation.

## 2023-07-05 LAB — URINE CULTURE: Culture: >=100000 — AB

## 2023-07-06 ENCOUNTER — Telehealth: Payer: Self-pay

## 2023-07-06 ENCOUNTER — Telehealth (HOSPITAL_BASED_OUTPATIENT_CLINIC_OR_DEPARTMENT_OTHER): Payer: Self-pay | Admitting: *Deleted

## 2023-07-06 NOTE — Telephone Encounter (Signed)
 Post ED Visit - Positive Culture Follow-up  Culture report reviewed by antimicrobial stewardship pharmacist: [facility] []  Enzo Bi, Pharm.D. []  Celedonio Miyamoto, Pharm.D., BCPS AQ-ID []  Garvin Fila, Pharm.D., BCPS []  Georgina Pillion, Pharm.D., BCPS []  [place], [address].D., BCPS, AAHIVP []  Estella Husk, Pharm.D., BCPS, AAHIVP []  Lysle Pearl, PharmD, BCPS []  Phillips Climes, PharmD, BCPS []  Agapito Games, PharmD, BCPS []  Verlan Friends, PharmD []  Mervyn Gay, PharmD, BCPS [x]  Judeen Hammans, PharmD  [facility] []  Len Childs, PharmD []  Greer Pickerel, PharmD []  Adalberto Cole, PharmD []  Perlie Gold, Rph []  Lonell Face) Jean Rosenthal, PharmD []  Earl Many, PharmD []  Junita Push, PharmD []  Dorna Leitz, PharmD []  Terrilee Files, PharmD []  Lynann Beaver, PharmD []  Keturah Barre, PharmD []  Loralee Pacas, PharmD []  Bernadene Person, PharmD   Positive urine culture No further patient follow-up is required at this time.  Patsey Berthold 07/06/2023, 8:56 AM

## 2023-07-06 NOTE — Telephone Encounter (Signed)
 Patiejnt called into say she missed a call from hospital and saw her culture results. She is having some urinary issues, occasional blood, but no fever or symptoms however she does denote she is paralyzed. She stated her PCP had called in macrobid, yet  the ED doc stated no antibiotics needed. Discussed colonization , symptoms. She decided that she would wait on Abx for now and use other methods, OTC and acidic juices. If no better or having more symptoms ( fever, etc) then sill follow up with PCP

## 2023-07-11 ENCOUNTER — Ambulatory Visit (HOSPITAL_COMMUNITY)

## 2023-09-05 ENCOUNTER — Other Ambulatory Visit (HOSPITAL_BASED_OUTPATIENT_CLINIC_OR_DEPARTMENT_OTHER): Payer: Self-pay | Admitting: Nurse Practitioner

## 2023-09-05 DIAGNOSIS — Z1231 Encounter for screening mammogram for malignant neoplasm of breast: Secondary | ICD-10-CM

## 2023-09-26 ENCOUNTER — Encounter: Attending: Skilled Nursing Facility1 | Admitting: Skilled Nursing Facility1

## 2023-09-26 DIAGNOSIS — E119 Type 2 diabetes mellitus without complications: Secondary | ICD-10-CM | POA: Diagnosis present

## 2023-09-26 NOTE — Progress Notes (Signed)
 A1C 9.6  Pt states she is pescaterian. Pt states she Does not leave the house typically due to paraplegia, states she does not transfer herself without someone being there so mostly stays in her bed throughout the day, also states she has issues with care givers being around as often as she needs them and pays out of the pocket for their services. Pt states she can sit up unassisted.  Pt states she threw out all food that had sugar in it when diagnosed with DM.  Pt states she has a Dog walker so will have them hand her food and things and delivery people will drop food and hand her stuff.  Pt states she Does struggle with soars currently with a pressure ulcer.  Pt states Due to her colostomy she avoids all skins, corn, and sesame Pt states the functionality of her colostomy is a main concern for her and worries about a blockage (last occurrence shortly after placed about 5+ years ago)  Dietitian taught pt how to use her glucometer and got a reading of 366 having eaten pureed mango.   Diabetes Self-Management Education  Visit Type: First/Initial  Appt. Start Time: 2:45 Appt. End Time: 4:00  09/30/2023  Ms. Ashley Gentry, identified by name and date of birth, is a 48 y.o. female with a diagnosis of Diabetes: Type 2.   ASSESSMENT  There were no vitals taken for this visit. There is no height or weight on file to calculate BMI.   Diabetes Self-Management Education - 09/30/23 1012       Visit Information   Visit Type First/Initial      Initial Visit   Diabetes Type Type 2    Are you currently following a meal plan? No      Health Coping   How would you rate your overall health? Good      Psychosocial Assessment   Patient Belief/Attitude about Diabetes Motivated to manage diabetes    What is the hardest part about your diabetes right now, causing you the most concern, or is the most worrisome to you about your diabetes?   Making healty food and beverage choices;Checking blood sugar     Self-care barriers Debilitated state due to current medical condition;Unsteady gait/risk for falls    Self-management support Family    Patient Concerns Nutrition/Meal planning;Glycemic Control    Special Needs Other (comment)    Preferred Learning Style Visual;Auditory    Learning Readiness Ready    How often do you need to have someone help you when you read instructions, pamphlets, or other written materials from your doctor or pharmacy? 1 - Never      Pre-Education Assessment   Patient understands the diabetes disease and treatment process. Needs Instruction    Patient understands incorporating nutritional management into lifestyle. Needs Instruction    Patient undertands incorporating physical activity into lifestyle. Needs Instruction    Patient understands using medications safely. Needs Instruction    Patient understands monitoring blood glucose, interpreting and using results Needs Instruction    Patient understands prevention, detection, and treatment of acute complications. Needs Instruction    Patient understands prevention, detection, and treatment of chronic complications. Needs Instruction    Patient understands how to develop strategies to address psychosocial issues. Needs Instruction    Patient understands how to develop strategies to promote health/change behavior. Needs Instruction      Complications   Last HgB A1C per patient/outside source 9.6 %    How often do you check  your blood sugar? 0 times/day (not testing)      Activity / Exercise   Activity / Exercise Type ADL's    How many days per week do you exercise? 0    How many minutes per day do you exercise? 0    Total minutes per week of exercise 0      Patient Education   Previous Diabetes Education No    Disease Pathophysiology Definition of diabetes, type 1 and 2, and the diagnosis of diabetes;Factors that contribute to the development of diabetes    Healthy Eating Role of diet in the treatment of  diabetes and the relationship between the three main macronutrients and blood glucose level;Food label reading, portion sizes and measuring food.;Plate Method;Reviewed blood glucose goals for pre and post meals and how to evaluate the patients' food intake on their blood glucose level.;Meal options for control of blood glucose level and chronic complications.    Monitoring Taught/evaluated SMBG meter.;Purpose and frequency of SMBG.    Chronic complications Dental care;Retinopathy and reason for yearly dilated eye exams;Nephropathy, what it is, prevention of, the use of ACE, ARB's and early detection of through urine microalbumia.;Lipid levels, blood glucose control and heart disease    Lifestyle and Health Coping Lifestyle issues that need to be addressed for better diabetes care      Individualized Goals (developed by patient)   Nutrition Follow meal plan discussed;General guidelines for healthy choices and portions discussed    Monitoring  Test my blood glucose as discussed;Test blood glucose pre and post meals as discussed    Problem Solving Eating Pattern    Reducing Risk treat hypoglycemia with 15 grams of carbs if blood glucose less than 70mg /dL      Post-Education Assessment   Patient understands the diabetes disease and treatment process. Demonstrates understanding / competency    Patient understands incorporating nutritional management into lifestyle. Demonstrates understanding / competency    Patient undertands incorporating physical activity into lifestyle. Demonstrates understanding / competency    Patient understands using medications safely. Demonstrates understanding / competency    Patient understands monitoring blood glucose, interpreting and using results Demonstrates understanding / competency    Patient understands prevention, detection, and treatment of acute complications. Demonstrates understanding / competency    Patient understands prevention, detection, and treatment of  chronic complications. Demonstrates understanding / competency    Patient understands how to develop strategies to address psychosocial issues. Demonstrates understanding / competency    Patient understands how to develop strategies to promote health/change behavior. Demonstrates understanding / competency      Outcomes   Expected Outcomes Demonstrated interest in learning. Expect positive outcomes    Future DMSE 4-6 wks    Program Status Completed          Individualized Plan for Diabetes Self-Management Training:   Learning Objective:  Patient will have a greater understanding of diabetes self-management. Patient education plan is to attend individual and/or group sessions per assessed needs and concerns.   Expected Outcomes:  Demonstrated interest in learning. Expect positive outcomes Emailed to pt Education material provided: ADA - How to Thrive: A Guide for Your Journey with Diabetes, Meal plan card, and My Plate  If problems or questions, patient to contact team via:  Phone and Email  Future DSME appointment: 4-6 wks

## 2023-09-30 ENCOUNTER — Encounter: Payer: Self-pay | Admitting: Skilled Nursing Facility1
